# Patient Record
Sex: Female | Born: 1935 | Race: White | Hispanic: No
Health system: Southern US, Community
[De-identification: ages and names within clinical notes are randomized; demographics above are authoritative.]

---

## 1998-10-24 ENCOUNTER — Encounter: Payer: Self-pay | Admitting: Orthopaedic Surgery

## 1998-10-27 ENCOUNTER — Inpatient Hospital Stay (HOSPITAL_COMMUNITY): Admission: RE | Admit: 1998-10-27 | Discharge: 1998-11-03 | Payer: Self-pay | Admitting: Orthopaedic Surgery

## 1998-10-27 ENCOUNTER — Encounter: Payer: Self-pay | Admitting: Orthopaedic Surgery

## 1998-11-03 ENCOUNTER — Inpatient Hospital Stay (HOSPITAL_COMMUNITY)
Admission: RE | Admit: 1998-11-03 | Discharge: 1998-11-11 | Payer: Self-pay | Admitting: Physical Medicine and Rehabilitation

## 1998-11-07 ENCOUNTER — Encounter: Payer: Self-pay | Admitting: Physical Medicine and Rehabilitation

## 2000-10-18 ENCOUNTER — Ambulatory Visit (HOSPITAL_COMMUNITY): Admission: RE | Admit: 2000-10-18 | Discharge: 2000-10-18 | Payer: Self-pay | Admitting: Internal Medicine

## 2000-12-19 ENCOUNTER — Encounter: Admission: RE | Admit: 2000-12-19 | Discharge: 2000-12-19 | Payer: Self-pay | Admitting: Internal Medicine

## 2000-12-19 ENCOUNTER — Encounter: Payer: Self-pay | Admitting: Internal Medicine

## 2000-12-20 ENCOUNTER — Ambulatory Visit (HOSPITAL_COMMUNITY): Admission: RE | Admit: 2000-12-20 | Discharge: 2000-12-20 | Payer: Self-pay | Admitting: Internal Medicine

## 2000-12-30 ENCOUNTER — Ambulatory Visit (HOSPITAL_COMMUNITY): Admission: RE | Admit: 2000-12-30 | Discharge: 2000-12-30 | Payer: Self-pay | Admitting: Internal Medicine

## 2000-12-30 ENCOUNTER — Encounter: Payer: Self-pay | Admitting: Internal Medicine

## 2001-01-10 ENCOUNTER — Encounter: Payer: Self-pay | Admitting: Internal Medicine

## 2001-01-10 ENCOUNTER — Ambulatory Visit (HOSPITAL_COMMUNITY): Admission: RE | Admit: 2001-01-10 | Discharge: 2001-01-10 | Payer: Self-pay | Admitting: Internal Medicine

## 2001-02-10 ENCOUNTER — Encounter: Payer: Self-pay | Admitting: Internal Medicine

## 2001-02-10 ENCOUNTER — Encounter: Admission: RE | Admit: 2001-02-10 | Discharge: 2001-02-10 | Payer: Self-pay | Admitting: Internal Medicine

## 2001-06-13 ENCOUNTER — Encounter: Payer: Self-pay | Admitting: Internal Medicine

## 2001-06-13 ENCOUNTER — Encounter: Admission: RE | Admit: 2001-06-13 | Discharge: 2001-06-13 | Payer: Self-pay | Admitting: Internal Medicine

## 2001-06-17 ENCOUNTER — Inpatient Hospital Stay (HOSPITAL_COMMUNITY): Admission: EM | Admit: 2001-06-17 | Discharge: 2001-06-24 | Payer: Self-pay | Admitting: Emergency Medicine

## 2001-06-19 ENCOUNTER — Encounter: Payer: Self-pay | Admitting: Cardiovascular Disease

## 2001-06-20 ENCOUNTER — Encounter: Payer: Self-pay | Admitting: Cardiovascular Disease

## 2001-06-23 ENCOUNTER — Encounter: Payer: Self-pay | Admitting: Cardiovascular Disease

## 2001-10-16 ENCOUNTER — Encounter (INDEPENDENT_AMBULATORY_CARE_PROVIDER_SITE_OTHER): Payer: Self-pay | Admitting: Specialist

## 2001-10-16 ENCOUNTER — Encounter: Payer: Self-pay | Admitting: Nephrology

## 2001-10-16 ENCOUNTER — Inpatient Hospital Stay (HOSPITAL_COMMUNITY): Admission: AD | Admit: 2001-10-16 | Discharge: 2001-10-18 | Payer: Self-pay | Admitting: Nephrology

## 2001-10-17 ENCOUNTER — Encounter: Payer: Self-pay | Admitting: Nephrology

## 2001-11-06 ENCOUNTER — Encounter (HOSPITAL_COMMUNITY): Admission: RE | Admit: 2001-11-06 | Discharge: 2002-02-04 | Payer: Self-pay | Admitting: Nephrology

## 2002-02-03 ENCOUNTER — Encounter: Payer: Self-pay | Admitting: Critical Care Medicine

## 2002-02-03 ENCOUNTER — Encounter (INDEPENDENT_AMBULATORY_CARE_PROVIDER_SITE_OTHER): Payer: Self-pay

## 2002-02-03 ENCOUNTER — Ambulatory Visit: Admission: RE | Admit: 2002-02-03 | Discharge: 2002-02-03 | Payer: Self-pay | Admitting: Critical Care Medicine

## 2002-02-05 ENCOUNTER — Encounter (HOSPITAL_COMMUNITY): Admission: RE | Admit: 2002-02-05 | Discharge: 2002-05-06 | Payer: Self-pay | Admitting: Nephrology

## 2002-03-26 ENCOUNTER — Encounter: Payer: Self-pay | Admitting: Nephrology

## 2002-05-14 ENCOUNTER — Encounter (HOSPITAL_COMMUNITY): Admission: RE | Admit: 2002-05-14 | Discharge: 2002-08-12 | Payer: Self-pay | Admitting: Nephrology

## 2002-07-30 ENCOUNTER — Encounter: Admission: RE | Admit: 2002-07-30 | Discharge: 2002-07-30 | Payer: Self-pay | Admitting: Endocrinology

## 2002-07-30 ENCOUNTER — Encounter: Payer: Self-pay | Admitting: Endocrinology

## 2002-08-20 ENCOUNTER — Encounter (HOSPITAL_COMMUNITY): Admission: RE | Admit: 2002-08-20 | Discharge: 2002-11-18 | Payer: Self-pay | Admitting: Nephrology

## 2002-08-24 ENCOUNTER — Encounter: Payer: Self-pay | Admitting: Vascular Surgery

## 2002-08-24 ENCOUNTER — Ambulatory Visit (HOSPITAL_COMMUNITY): Admission: RE | Admit: 2002-08-24 | Discharge: 2002-08-24 | Payer: Self-pay | Admitting: Vascular Surgery

## 2002-10-16 ENCOUNTER — Ambulatory Visit (HOSPITAL_COMMUNITY): Admission: RE | Admit: 2002-10-16 | Discharge: 2002-10-17 | Payer: Self-pay | Admitting: Cardiology

## 2002-10-17 ENCOUNTER — Encounter: Payer: Self-pay | Admitting: Cardiology

## 2002-11-09 ENCOUNTER — Encounter: Payer: Self-pay | Admitting: Emergency Medicine

## 2002-11-10 ENCOUNTER — Encounter: Payer: Self-pay | Admitting: General Surgery

## 2002-11-10 ENCOUNTER — Inpatient Hospital Stay (HOSPITAL_COMMUNITY): Admission: EM | Admit: 2002-11-10 | Discharge: 2002-11-20 | Payer: Self-pay | Admitting: Emergency Medicine

## 2002-11-12 ENCOUNTER — Encounter (INDEPENDENT_AMBULATORY_CARE_PROVIDER_SITE_OTHER): Payer: Self-pay | Admitting: Specialist

## 2002-11-12 ENCOUNTER — Encounter: Payer: Self-pay | Admitting: General Surgery

## 2002-11-14 ENCOUNTER — Encounter: Payer: Self-pay | Admitting: General Surgery

## 2002-11-15 ENCOUNTER — Encounter: Payer: Self-pay | Admitting: General Surgery

## 2002-11-16 ENCOUNTER — Encounter: Payer: Self-pay | Admitting: General Surgery

## 2002-11-20 ENCOUNTER — Inpatient Hospital Stay: Admission: RE | Admit: 2002-11-20 | Discharge: 2002-11-27 | Payer: Self-pay | Admitting: Endocrinology

## 2002-11-24 ENCOUNTER — Encounter: Payer: Self-pay | Admitting: Endocrinology

## 2002-12-04 ENCOUNTER — Inpatient Hospital Stay (HOSPITAL_COMMUNITY): Admission: EM | Admit: 2002-12-04 | Discharge: 2002-12-12 | Payer: Self-pay | Admitting: Emergency Medicine

## 2002-12-04 ENCOUNTER — Encounter: Payer: Self-pay | Admitting: Emergency Medicine

## 2002-12-05 ENCOUNTER — Encounter: Payer: Self-pay | Admitting: Cardiology

## 2002-12-24 ENCOUNTER — Encounter (HOSPITAL_COMMUNITY): Admission: RE | Admit: 2002-12-24 | Discharge: 2003-03-24 | Payer: Self-pay | Admitting: Nephrology

## 2003-04-08 ENCOUNTER — Encounter (HOSPITAL_COMMUNITY): Admission: RE | Admit: 2003-04-08 | Discharge: 2003-07-07 | Payer: Self-pay | Admitting: Nephrology

## 2003-05-14 ENCOUNTER — Encounter: Payer: Self-pay | Admitting: Endocrinology

## 2003-05-14 ENCOUNTER — Encounter: Admission: RE | Admit: 2003-05-14 | Discharge: 2003-05-14 | Payer: Self-pay | Admitting: Endocrinology

## 2003-07-08 ENCOUNTER — Encounter (HOSPITAL_COMMUNITY): Admission: RE | Admit: 2003-07-08 | Discharge: 2003-10-06 | Payer: Self-pay | Admitting: Nephrology

## 2003-08-23 ENCOUNTER — Ambulatory Visit (HOSPITAL_COMMUNITY): Admission: RE | Admit: 2003-08-23 | Discharge: 2003-08-23 | Payer: Self-pay | Admitting: Endocrinology

## 2003-10-28 ENCOUNTER — Encounter (HOSPITAL_COMMUNITY): Admission: RE | Admit: 2003-10-28 | Discharge: 2004-01-26 | Payer: Self-pay | Admitting: Nephrology

## 2004-01-27 ENCOUNTER — Encounter (HOSPITAL_COMMUNITY): Admission: RE | Admit: 2004-01-27 | Discharge: 2004-04-26 | Payer: Self-pay | Admitting: Nephrology

## 2004-04-02 ENCOUNTER — Emergency Department (HOSPITAL_COMMUNITY): Admission: EM | Admit: 2004-04-02 | Discharge: 2004-04-02 | Payer: Self-pay | Admitting: Emergency Medicine

## 2004-04-27 ENCOUNTER — Encounter (HOSPITAL_COMMUNITY): Admission: RE | Admit: 2004-04-27 | Discharge: 2004-07-26 | Payer: Self-pay | Admitting: Nephrology

## 2004-06-22 ENCOUNTER — Other Ambulatory Visit: Admission: RE | Admit: 2004-06-22 | Discharge: 2004-06-22 | Payer: Self-pay | Admitting: Endocrinology

## 2004-06-29 ENCOUNTER — Ambulatory Visit (HOSPITAL_COMMUNITY): Admission: RE | Admit: 2004-06-29 | Discharge: 2004-06-29 | Payer: Self-pay | Admitting: Critical Care Medicine

## 2004-07-27 ENCOUNTER — Encounter (HOSPITAL_COMMUNITY): Admission: RE | Admit: 2004-07-27 | Discharge: 2004-10-25 | Payer: Self-pay | Admitting: Nephrology

## 2004-07-31 ENCOUNTER — Inpatient Hospital Stay (HOSPITAL_COMMUNITY): Admission: EM | Admit: 2004-07-31 | Discharge: 2004-08-04 | Payer: Self-pay | Admitting: Emergency Medicine

## 2004-08-01 ENCOUNTER — Encounter: Payer: Self-pay | Admitting: Cardiovascular Disease

## 2004-08-11 ENCOUNTER — Ambulatory Visit: Payer: Self-pay | Admitting: Critical Care Medicine

## 2004-08-23 ENCOUNTER — Ambulatory Visit: Payer: Self-pay | Admitting: Endocrinology

## 2004-09-06 ENCOUNTER — Encounter: Admission: RE | Admit: 2004-09-06 | Discharge: 2004-09-06 | Payer: Self-pay | Admitting: Endocrinology

## 2004-09-14 ENCOUNTER — Ambulatory Visit: Payer: Self-pay | Admitting: Critical Care Medicine

## 2004-10-04 ENCOUNTER — Ambulatory Visit: Payer: Self-pay | Admitting: Endocrinology

## 2004-10-10 ENCOUNTER — Ambulatory Visit: Payer: Self-pay | Admitting: Internal Medicine

## 2004-10-16 ENCOUNTER — Ambulatory Visit: Payer: Self-pay | Admitting: Internal Medicine

## 2004-10-26 ENCOUNTER — Encounter (HOSPITAL_COMMUNITY): Admission: RE | Admit: 2004-10-26 | Discharge: 2005-01-24 | Payer: Self-pay | Admitting: Nephrology

## 2004-11-10 ENCOUNTER — Ambulatory Visit: Payer: Self-pay | Admitting: Critical Care Medicine

## 2004-11-16 ENCOUNTER — Ambulatory Visit: Payer: Self-pay | Admitting: Endocrinology

## 2005-01-09 ENCOUNTER — Ambulatory Visit: Payer: Self-pay | Admitting: Critical Care Medicine

## 2005-02-01 ENCOUNTER — Encounter (HOSPITAL_COMMUNITY): Admission: RE | Admit: 2005-02-01 | Discharge: 2005-05-02 | Payer: Self-pay | Admitting: Nephrology

## 2005-02-08 ENCOUNTER — Ambulatory Visit: Payer: Self-pay | Admitting: Critical Care Medicine

## 2005-03-26 ENCOUNTER — Ambulatory Visit (HOSPITAL_COMMUNITY): Admission: RE | Admit: 2005-03-26 | Discharge: 2005-03-26 | Payer: Self-pay | Admitting: Vascular Surgery

## 2005-03-29 ENCOUNTER — Ambulatory Visit: Payer: Self-pay | Admitting: Critical Care Medicine

## 2005-04-09 ENCOUNTER — Inpatient Hospital Stay (HOSPITAL_COMMUNITY): Admission: AD | Admit: 2005-04-09 | Discharge: 2005-04-14 | Payer: Self-pay | Admitting: Nephrology

## 2005-04-11 ENCOUNTER — Ambulatory Visit: Payer: Self-pay | Admitting: Internal Medicine

## 2005-04-12 ENCOUNTER — Encounter (INDEPENDENT_AMBULATORY_CARE_PROVIDER_SITE_OTHER): Payer: Self-pay | Admitting: *Deleted

## 2005-04-12 ENCOUNTER — Encounter: Payer: Self-pay | Admitting: Internal Medicine

## 2005-04-12 DIAGNOSIS — K222 Esophageal obstruction: Secondary | ICD-10-CM | POA: Insufficient documentation

## 2005-04-12 DIAGNOSIS — D126 Benign neoplasm of colon, unspecified: Secondary | ICD-10-CM

## 2005-04-13 ENCOUNTER — Ambulatory Visit: Payer: Self-pay | Admitting: Internal Medicine

## 2005-04-13 ENCOUNTER — Encounter (INDEPENDENT_AMBULATORY_CARE_PROVIDER_SITE_OTHER): Payer: Self-pay | Admitting: *Deleted

## 2005-05-22 ENCOUNTER — Ambulatory Visit: Payer: Self-pay | Admitting: Internal Medicine

## 2005-05-24 ENCOUNTER — Ambulatory Visit: Payer: Self-pay | Admitting: Critical Care Medicine

## 2005-07-03 ENCOUNTER — Ambulatory Visit: Payer: Self-pay | Admitting: Endocrinology

## 2005-07-04 ENCOUNTER — Encounter: Admission: RE | Admit: 2005-07-04 | Discharge: 2005-07-04 | Payer: Self-pay | Admitting: Nephrology

## 2005-07-31 ENCOUNTER — Ambulatory Visit (HOSPITAL_COMMUNITY): Admission: RE | Admit: 2005-07-31 | Discharge: 2005-07-31 | Payer: Self-pay | Admitting: Cardiovascular Disease

## 2005-08-16 ENCOUNTER — Ambulatory Visit: Payer: Self-pay | Admitting: Internal Medicine

## 2005-08-16 ENCOUNTER — Ambulatory Visit: Payer: Self-pay | Admitting: Critical Care Medicine

## 2005-09-18 ENCOUNTER — Ambulatory Visit (HOSPITAL_COMMUNITY): Admission: RE | Admit: 2005-09-18 | Discharge: 2005-09-18 | Payer: Self-pay | Admitting: Nephrology

## 2005-09-27 ENCOUNTER — Ambulatory Visit: Payer: Self-pay | Admitting: Internal Medicine

## 2005-10-25 ENCOUNTER — Inpatient Hospital Stay (HOSPITAL_COMMUNITY): Admission: RE | Admit: 2005-10-25 | Discharge: 2005-10-26 | Payer: Self-pay | Admitting: Vascular Surgery

## 2005-10-25 ENCOUNTER — Encounter (INDEPENDENT_AMBULATORY_CARE_PROVIDER_SITE_OTHER): Payer: Self-pay | Admitting: *Deleted

## 2005-11-13 ENCOUNTER — Encounter: Admission: RE | Admit: 2005-11-13 | Discharge: 2005-11-13 | Payer: Self-pay | Admitting: Vascular Surgery

## 2005-11-15 ENCOUNTER — Ambulatory Visit: Payer: Self-pay | Admitting: Critical Care Medicine

## 2005-11-20 ENCOUNTER — Ambulatory Visit (HOSPITAL_COMMUNITY): Admission: RE | Admit: 2005-11-20 | Discharge: 2005-11-20 | Payer: Self-pay | Admitting: Thoracic Surgery

## 2005-11-22 ENCOUNTER — Ambulatory Visit: Admission: RE | Admit: 2005-11-22 | Discharge: 2005-11-22 | Payer: Self-pay | Admitting: Critical Care Medicine

## 2005-12-11 ENCOUNTER — Ambulatory Visit (HOSPITAL_COMMUNITY): Admission: RE | Admit: 2005-12-11 | Discharge: 2005-12-11 | Payer: Self-pay | Admitting: Surgery

## 2005-12-11 ENCOUNTER — Encounter (INDEPENDENT_AMBULATORY_CARE_PROVIDER_SITE_OTHER): Payer: Self-pay | Admitting: *Deleted

## 2005-12-20 ENCOUNTER — Encounter: Admission: RE | Admit: 2005-12-20 | Discharge: 2005-12-20 | Payer: Self-pay | Admitting: Surgery

## 2005-12-24 ENCOUNTER — Ambulatory Visit (HOSPITAL_COMMUNITY): Admission: RE | Admit: 2005-12-24 | Discharge: 2005-12-24 | Payer: Self-pay | Admitting: Nephrology

## 2005-12-26 ENCOUNTER — Ambulatory Visit: Admission: RE | Admit: 2005-12-26 | Discharge: 2006-03-26 | Payer: Self-pay | Admitting: Radiation Oncology

## 2005-12-27 ENCOUNTER — Inpatient Hospital Stay (HOSPITAL_COMMUNITY): Admission: RE | Admit: 2005-12-27 | Discharge: 2006-01-08 | Payer: Self-pay | Admitting: Surgery

## 2005-12-27 ENCOUNTER — Encounter (INDEPENDENT_AMBULATORY_CARE_PROVIDER_SITE_OTHER): Payer: Self-pay | Admitting: Specialist

## 2006-01-15 ENCOUNTER — Encounter: Admission: RE | Admit: 2006-01-15 | Discharge: 2006-01-15 | Payer: Self-pay | Admitting: Surgery

## 2006-02-05 ENCOUNTER — Encounter: Admission: RE | Admit: 2006-02-05 | Discharge: 2006-02-05 | Payer: Self-pay | Admitting: Surgery

## 2006-05-20 ENCOUNTER — Encounter: Admission: RE | Admit: 2006-05-20 | Discharge: 2006-05-20 | Payer: Self-pay | Admitting: Surgery

## 2006-05-30 ENCOUNTER — Ambulatory Visit (HOSPITAL_COMMUNITY): Admission: RE | Admit: 2006-05-30 | Discharge: 2006-05-30 | Payer: Self-pay | Admitting: Nephrology

## 2006-06-04 ENCOUNTER — Encounter: Admission: RE | Admit: 2006-06-04 | Discharge: 2006-06-04 | Payer: Self-pay | Admitting: Surgery

## 2006-06-06 ENCOUNTER — Ambulatory Visit (HOSPITAL_COMMUNITY): Admission: RE | Admit: 2006-06-06 | Discharge: 2006-06-06 | Payer: Self-pay | Admitting: Surgery

## 2006-06-14 ENCOUNTER — Ambulatory Visit: Payer: Self-pay | Admitting: Internal Medicine

## 2006-06-18 LAB — CBC WITH DIFFERENTIAL/PLATELET
Basophils Absolute: 0.2 10*3/uL — ABNORMAL HIGH (ref 0.0–0.1)
Eosinophils Absolute: 0.1 10*3/uL (ref 0.0–0.5)
HCT: 43 % (ref 34.8–46.6)
HGB: 13.8 g/dL (ref 11.6–15.9)
LYMPH%: 10 % — ABNORMAL LOW (ref 14.0–48.0)
MCV: 83.2 fL (ref 81.0–101.0)
MONO%: 9.4 % (ref 0.0–13.0)
NEUT#: 3 10*3/uL (ref 1.5–6.5)
Platelets: 144 10*3/uL — ABNORMAL LOW (ref 145–400)

## 2006-06-18 LAB — COMPREHENSIVE METABOLIC PANEL
Albumin: 4.3 g/dL (ref 3.5–5.2)
Alkaline Phosphatase: 145 U/L — ABNORMAL HIGH (ref 39–117)
BUN: 33 mg/dL — ABNORMAL HIGH (ref 6–23)
Glucose, Bld: 99 mg/dL (ref 70–99)
Potassium: 4.2 mEq/L (ref 3.5–5.3)

## 2006-07-02 ENCOUNTER — Encounter (INDEPENDENT_AMBULATORY_CARE_PROVIDER_SITE_OTHER): Payer: Self-pay | Admitting: *Deleted

## 2006-07-02 ENCOUNTER — Ambulatory Visit (HOSPITAL_COMMUNITY): Admission: RE | Admit: 2006-07-02 | Discharge: 2006-07-02 | Payer: Self-pay | Admitting: Internal Medicine

## 2006-07-04 ENCOUNTER — Encounter (INDEPENDENT_AMBULATORY_CARE_PROVIDER_SITE_OTHER): Payer: Self-pay | Admitting: Specialist

## 2006-07-04 ENCOUNTER — Inpatient Hospital Stay (HOSPITAL_COMMUNITY): Admission: RE | Admit: 2006-07-04 | Discharge: 2006-07-13 | Payer: Self-pay | Admitting: Internal Medicine

## 2006-07-04 ENCOUNTER — Ambulatory Visit: Payer: Self-pay | Admitting: Pulmonary Disease

## 2006-07-05 ENCOUNTER — Encounter: Payer: Self-pay | Admitting: Cardiovascular Disease

## 2006-07-05 ENCOUNTER — Encounter (INDEPENDENT_AMBULATORY_CARE_PROVIDER_SITE_OTHER): Payer: Self-pay | Admitting: Interventional Radiology

## 2006-07-12 ENCOUNTER — Ambulatory Visit: Payer: Self-pay | Admitting: Internal Medicine

## 2006-07-29 ENCOUNTER — Inpatient Hospital Stay (HOSPITAL_COMMUNITY): Admission: EM | Admit: 2006-07-29 | Discharge: 2006-08-13 | Payer: Self-pay | Admitting: Emergency Medicine

## 2006-07-30 ENCOUNTER — Ambulatory Visit: Payer: Self-pay | Admitting: Internal Medicine

## 2006-08-27 ENCOUNTER — Encounter: Admission: RE | Admit: 2006-08-27 | Discharge: 2006-08-27 | Payer: Self-pay | Admitting: Surgery

## 2006-09-05 LAB — CBC WITH DIFFERENTIAL/PLATELET
Basophils Absolute: 0.1 10*3/uL (ref 0.0–0.1)
Eosinophils Absolute: 0.6 10*3/uL — ABNORMAL HIGH (ref 0.0–0.5)
HGB: 14.3 g/dL (ref 11.6–15.9)
LYMPH%: 33.7 % (ref 14.0–48.0)
MCH: 27.3 pg (ref 26.0–34.0)
MCV: 89.2 fL (ref 81.0–101.0)
MONO%: 17 % — ABNORMAL HIGH (ref 0.0–13.0)
NEUT#: 3.5 10*3/uL (ref 1.5–6.5)
Platelets: 185 10*3/uL (ref 145–400)
RBC: 5.25 10*6/uL (ref 3.70–5.32)

## 2006-09-05 LAB — COMPREHENSIVE METABOLIC PANEL
Alkaline Phosphatase: 173 U/L — ABNORMAL HIGH (ref 39–117)
BUN: 37 mg/dL — ABNORMAL HIGH (ref 6–23)
Glucose, Bld: 89 mg/dL (ref 70–99)
Total Bilirubin: 1 mg/dL (ref 0.3–1.2)

## 2006-09-16 ENCOUNTER — Ambulatory Visit (HOSPITAL_COMMUNITY): Admission: RE | Admit: 2006-09-16 | Discharge: 2006-09-16 | Payer: Self-pay | Admitting: Vascular Surgery

## 2006-09-16 ENCOUNTER — Emergency Department (HOSPITAL_COMMUNITY): Admission: EM | Admit: 2006-09-16 | Discharge: 2006-09-16 | Payer: Self-pay | Admitting: Emergency Medicine

## 2006-09-16 ENCOUNTER — Emergency Department (HOSPITAL_COMMUNITY): Admission: EM | Admit: 2006-09-16 | Discharge: 2006-09-17 | Payer: Self-pay | Admitting: Emergency Medicine

## 2006-09-17 ENCOUNTER — Emergency Department (HOSPITAL_COMMUNITY): Admission: EM | Admit: 2006-09-17 | Discharge: 2006-09-17 | Payer: Self-pay | Admitting: Emergency Medicine

## 2006-09-17 ENCOUNTER — Inpatient Hospital Stay (HOSPITAL_COMMUNITY): Admission: AD | Admit: 2006-09-17 | Discharge: 2006-09-20 | Payer: Self-pay | Admitting: Otolaryngology

## 2006-10-10 ENCOUNTER — Ambulatory Visit (HOSPITAL_COMMUNITY): Admission: RE | Admit: 2006-10-10 | Discharge: 2006-10-10 | Payer: Self-pay | Admitting: Internal Medicine

## 2006-10-14 ENCOUNTER — Ambulatory Visit: Payer: Self-pay | Admitting: Internal Medicine

## 2006-10-15 ENCOUNTER — Ambulatory Visit (HOSPITAL_COMMUNITY): Admission: RE | Admit: 2006-10-15 | Discharge: 2006-10-15 | Payer: Self-pay | Admitting: Nephrology

## 2006-10-17 LAB — CBC WITH DIFFERENTIAL/PLATELET
Basophils Absolute: 0.1 10*3/uL (ref 0.0–0.1)
Eosinophils Absolute: 0.4 10*3/uL (ref 0.0–0.5)
HGB: 13.1 g/dL (ref 11.6–15.9)
MCV: 85.9 fL (ref 81.0–101.0)
MONO#: 0.6 10*3/uL (ref 0.1–0.9)
MONO%: 9.3 % (ref 0.0–13.0)
NEUT#: 4.2 10*3/uL (ref 1.5–6.5)
RDW: 21.5 % — ABNORMAL HIGH (ref 11.3–14.5)
lymph#: 1.3 10*3/uL (ref 0.9–3.3)

## 2006-10-17 LAB — COMPREHENSIVE METABOLIC PANEL
Albumin: 3.8 g/dL (ref 3.5–5.2)
BUN: 22 mg/dL (ref 6–23)
CO2: 25 mEq/L (ref 19–32)
Calcium: 9.6 mg/dL (ref 8.4–10.5)
Chloride: 98 mEq/L (ref 96–112)
Glucose, Bld: 81 mg/dL (ref 70–99)
Potassium: 4.2 mEq/L (ref 3.5–5.3)

## 2006-10-17 LAB — LACTATE DEHYDROGENASE: LDH: 235 U/L (ref 94–250)

## 2006-10-30 ENCOUNTER — Ambulatory Visit (HOSPITAL_COMMUNITY): Admission: RE | Admit: 2006-10-30 | Discharge: 2006-10-30 | Payer: Self-pay | Admitting: Vascular Surgery

## 2006-12-12 ENCOUNTER — Ambulatory Visit (HOSPITAL_COMMUNITY): Admission: RE | Admit: 2006-12-12 | Discharge: 2006-12-12 | Payer: Self-pay | Admitting: Vascular Surgery

## 2006-12-31 ENCOUNTER — Ambulatory Visit: Payer: Self-pay | Admitting: Vascular Surgery

## 2007-01-09 ENCOUNTER — Ambulatory Visit: Payer: Self-pay | Admitting: Internal Medicine

## 2007-01-14 ENCOUNTER — Ambulatory Visit (HOSPITAL_COMMUNITY): Admission: RE | Admit: 2007-01-14 | Discharge: 2007-01-14 | Payer: Self-pay | Admitting: Internal Medicine

## 2007-01-14 LAB — CBC WITH DIFFERENTIAL/PLATELET
BASO%: 2.9 % — ABNORMAL HIGH (ref 0.0–2.0)
Eosinophils Absolute: 0.5 10*3/uL (ref 0.0–0.5)
MCHC: 32.8 g/dL (ref 32.0–36.0)
MONO#: 0.7 10*3/uL (ref 0.1–0.9)
NEUT#: 3.1 10*3/uL (ref 1.5–6.5)
RBC: 4.49 10*6/uL (ref 3.70–5.32)
RDW: 25.5 % — ABNORMAL HIGH (ref 11.3–14.5)
WBC: 5.6 10*3/uL (ref 3.9–10.0)

## 2007-01-14 LAB — COMPREHENSIVE METABOLIC PANEL
ALT: 26 U/L (ref 0–35)
Albumin: 3.6 g/dL (ref 3.5–5.2)
Alkaline Phosphatase: 118 U/L — ABNORMAL HIGH (ref 39–117)
CO2: 33 mEq/L — ABNORMAL HIGH (ref 19–32)
Glucose, Bld: 87 mg/dL (ref 70–99)
Potassium: 3.7 mEq/L (ref 3.5–5.3)
Sodium: 137 mEq/L (ref 135–145)
Total Protein: 6.9 g/dL (ref 6.0–8.3)

## 2007-01-14 LAB — LACTATE DEHYDROGENASE: LDH: 153 U/L (ref 94–250)

## 2007-02-12 ENCOUNTER — Ambulatory Visit: Payer: Self-pay | Admitting: Internal Medicine

## 2007-02-18 ENCOUNTER — Encounter: Payer: Self-pay | Admitting: Internal Medicine

## 2007-02-18 ENCOUNTER — Ambulatory Visit (HOSPITAL_COMMUNITY): Admission: RE | Admit: 2007-02-18 | Discharge: 2007-02-18 | Payer: Self-pay | Admitting: Internal Medicine

## 2007-02-18 DIAGNOSIS — K2970 Gastritis, unspecified, without bleeding: Secondary | ICD-10-CM | POA: Insufficient documentation

## 2007-02-18 DIAGNOSIS — K2990 Gastroduodenitis, unspecified, without bleeding: Secondary | ICD-10-CM

## 2007-02-25 ENCOUNTER — Ambulatory Visit: Payer: Self-pay | Admitting: Pulmonary Disease

## 2007-02-25 ENCOUNTER — Encounter: Admission: RE | Admit: 2007-02-25 | Discharge: 2007-02-25 | Payer: Self-pay | Admitting: Pulmonary Disease

## 2007-03-06 ENCOUNTER — Ambulatory Visit: Payer: Self-pay | Admitting: Internal Medicine

## 2007-03-11 ENCOUNTER — Encounter: Admission: RE | Admit: 2007-03-11 | Discharge: 2007-03-11 | Payer: Self-pay | Admitting: Nephrology

## 2007-03-27 ENCOUNTER — Ambulatory Visit: Payer: Self-pay | Admitting: Critical Care Medicine

## 2007-04-10 ENCOUNTER — Ambulatory Visit: Payer: Self-pay | Admitting: Internal Medicine

## 2007-04-15 ENCOUNTER — Ambulatory Visit (HOSPITAL_COMMUNITY): Admission: RE | Admit: 2007-04-15 | Discharge: 2007-04-15 | Payer: Self-pay | Admitting: Internal Medicine

## 2007-04-15 LAB — COMPREHENSIVE METABOLIC PANEL
ALT: 21 U/L (ref 0–35)
AST: 29 U/L (ref 0–37)
Alkaline Phosphatase: 120 U/L — ABNORMAL HIGH (ref 39–117)
CO2: 29 mEq/L (ref 19–32)
Creatinine, Ser: 3.77 mg/dL — ABNORMAL HIGH (ref 0.40–1.20)
Sodium: 133 mEq/L — ABNORMAL LOW (ref 135–145)
Total Bilirubin: 1.3 mg/dL — ABNORMAL HIGH (ref 0.3–1.2)
Total Protein: 7 g/dL (ref 6.0–8.3)

## 2007-04-15 LAB — CBC WITH DIFFERENTIAL/PLATELET
BASO%: 1.4 % (ref 0.0–2.0)
EOS%: 8.9 % — ABNORMAL HIGH (ref 0.0–7.0)
LYMPH%: 23.8 % (ref 14.0–48.0)
MCH: 27.8 pg (ref 26.0–34.0)
MCHC: 33 g/dL (ref 32.0–36.0)
MONO#: 0.5 10*3/uL (ref 0.1–0.9)
Platelets: 126 10*3/uL — ABNORMAL LOW (ref 145–400)
RBC: 5.24 10*6/uL (ref 3.70–5.32)
WBC: 5.7 10*3/uL (ref 3.9–10.0)

## 2007-04-15 LAB — LACTATE DEHYDROGENASE: LDH: 147 U/L (ref 94–250)

## 2007-04-29 ENCOUNTER — Ambulatory Visit: Payer: Self-pay | Admitting: Critical Care Medicine

## 2007-05-14 ENCOUNTER — Ambulatory Visit: Payer: Self-pay | Admitting: Internal Medicine

## 2007-05-14 ENCOUNTER — Inpatient Hospital Stay (HOSPITAL_COMMUNITY): Admission: EM | Admit: 2007-05-14 | Discharge: 2007-05-22 | Payer: Self-pay | Admitting: Emergency Medicine

## 2007-05-14 ENCOUNTER — Ambulatory Visit: Payer: Self-pay | Admitting: Pulmonary Disease

## 2007-05-21 ENCOUNTER — Ambulatory Visit: Payer: Self-pay | Admitting: Gastroenterology

## 2007-05-22 ENCOUNTER — Encounter: Payer: Self-pay | Admitting: Internal Medicine

## 2007-07-01 ENCOUNTER — Ambulatory Visit: Payer: Self-pay | Admitting: Vascular Surgery

## 2007-07-11 ENCOUNTER — Ambulatory Visit: Payer: Self-pay | Admitting: Internal Medicine

## 2007-07-15 ENCOUNTER — Ambulatory Visit (HOSPITAL_COMMUNITY): Admission: RE | Admit: 2007-07-15 | Discharge: 2007-07-15 | Payer: Self-pay | Admitting: Internal Medicine

## 2007-07-15 LAB — COMPREHENSIVE METABOLIC PANEL
ALT: 21 U/L (ref 0–35)
AST: 28 U/L (ref 0–37)
BUN: 22 mg/dL (ref 6–23)
Calcium: 9.9 mg/dL (ref 8.4–10.5)
Chloride: 94 mEq/L — ABNORMAL LOW (ref 96–112)
Creatinine, Ser: 3.72 mg/dL — ABNORMAL HIGH (ref 0.40–1.20)
Total Bilirubin: 0.8 mg/dL (ref 0.3–1.2)

## 2007-07-15 LAB — CBC WITH DIFFERENTIAL/PLATELET
BASO%: 1 % (ref 0.0–2.0)
Basophils Absolute: 0.1 10*3/uL (ref 0.0–0.1)
EOS%: 3.5 % (ref 0.0–7.0)
HCT: 37.3 % (ref 34.8–46.6)
HGB: 12.2 g/dL (ref 11.6–15.9)
LYMPH%: 24.5 % (ref 14.0–48.0)
MCH: 28.4 pg (ref 26.0–34.0)
MCHC: 32.6 g/dL (ref 32.0–36.0)
MCV: 87 fL (ref 81.0–101.0)
NEUT%: 55 % (ref 39.6–76.8)
Platelets: 215 10*3/uL (ref 145–400)

## 2007-07-22 ENCOUNTER — Ambulatory Visit: Payer: Self-pay | Admitting: Internal Medicine

## 2007-07-29 ENCOUNTER — Encounter (INDEPENDENT_AMBULATORY_CARE_PROVIDER_SITE_OTHER): Payer: Self-pay | Admitting: Diagnostic Radiology

## 2007-07-29 ENCOUNTER — Inpatient Hospital Stay (HOSPITAL_COMMUNITY): Admission: EM | Admit: 2007-07-29 | Discharge: 2007-08-14 | Payer: Self-pay | Admitting: Emergency Medicine

## 2007-07-29 ENCOUNTER — Ambulatory Visit: Payer: Self-pay | Admitting: Pulmonary Disease

## 2007-08-05 ENCOUNTER — Ambulatory Visit: Payer: Self-pay | Admitting: Surgery

## 2007-08-06 ENCOUNTER — Encounter: Payer: Self-pay | Admitting: Cardiovascular Disease

## 2007-08-08 ENCOUNTER — Ambulatory Visit: Payer: Self-pay | Admitting: Internal Medicine

## 2007-08-08 ENCOUNTER — Encounter: Payer: Self-pay | Admitting: Cardiovascular Disease

## 2007-12-12 DIAGNOSIS — J449 Chronic obstructive pulmonary disease, unspecified: Secondary | ICD-10-CM

## 2007-12-12 DIAGNOSIS — I509 Heart failure, unspecified: Secondary | ICD-10-CM | POA: Insufficient documentation

## 2007-12-12 DIAGNOSIS — M1990 Unspecified osteoarthritis, unspecified site: Secondary | ICD-10-CM | POA: Insufficient documentation

## 2007-12-12 DIAGNOSIS — D649 Anemia, unspecified: Secondary | ICD-10-CM

## 2007-12-12 DIAGNOSIS — I2510 Atherosclerotic heart disease of native coronary artery without angina pectoris: Secondary | ICD-10-CM | POA: Insufficient documentation

## 2007-12-12 DIAGNOSIS — I10 Essential (primary) hypertension: Secondary | ICD-10-CM | POA: Insufficient documentation

## 2007-12-12 DIAGNOSIS — N186 End stage renal disease: Secondary | ICD-10-CM

## 2007-12-12 DIAGNOSIS — C8589 Other specified types of non-Hodgkin lymphoma, extranodal and solid organ sites: Secondary | ICD-10-CM

## 2007-12-12 DIAGNOSIS — C3490 Malignant neoplasm of unspecified part of unspecified bronchus or lung: Secondary | ICD-10-CM | POA: Insufficient documentation

## 2007-12-12 DIAGNOSIS — I4891 Unspecified atrial fibrillation: Secondary | ICD-10-CM

## 2007-12-12 DIAGNOSIS — I2789 Other specified pulmonary heart diseases: Secondary | ICD-10-CM | POA: Insufficient documentation

## 2007-12-12 DIAGNOSIS — E039 Hypothyroidism, unspecified: Secondary | ICD-10-CM | POA: Insufficient documentation

## 2007-12-12 DIAGNOSIS — E278 Other specified disorders of adrenal gland: Secondary | ICD-10-CM | POA: Insufficient documentation

## 2007-12-12 DIAGNOSIS — J4489 Other specified chronic obstructive pulmonary disease: Secondary | ICD-10-CM | POA: Insufficient documentation

## 2007-12-12 DIAGNOSIS — E7800 Pure hypercholesterolemia, unspecified: Secondary | ICD-10-CM

## 2007-12-12 DIAGNOSIS — N2581 Secondary hyperparathyroidism of renal origin: Secondary | ICD-10-CM | POA: Insufficient documentation

## 2008-04-09 IMAGING — CT NM PET TUM IMG SKULL BASE T - THIGH
4 series · 25 of 25 positions shown · IV contrast (350 OM)
Comparison: PET of 11/20/05 and chest CT of 06/04/06.

CLINICAL DATA: Left lower lobe lung cancer.  COPD.  Dialysis.  Resection 12/27/05.  Radioactive bead placement.  Recent nodule identified in the left upper lobe.  
FDG PET-CT TUMOR IMAGING (SKULL BASE TO THIGHS):

Fasting Blood Glucose:  91.
TECHNIQUE: 15.3 mCi F-18 FDG were administered via right forearm.  Full ring PET imaging was performed from the skull base through the mid-thighs 65 minutes after injection.  CT data was obtained and used for attenuation correction and anatomic localization only.  (This was not acquired as a diagnostic CT examination.)

[Series 1: pet ac · axial · 3.3mm · 4.69mm/px · z∈[-744,-18]mm · 8 of 223 slices shown]
[im 1/223]
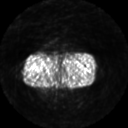
[im 32/223]
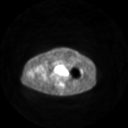
[im 64/223]
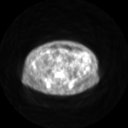
[im 96/223]
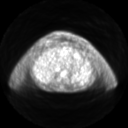
[im 127/223]
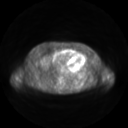
[im 159/223]
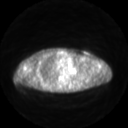
[im 191/223]
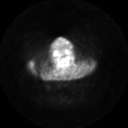
[im 223/223]
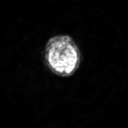

[Series 2: ct images · axial · 3.8mm · 0.98mm/px · z∈[-744,-18]mm · 8 of 223 slices shown]
[im 1/223]
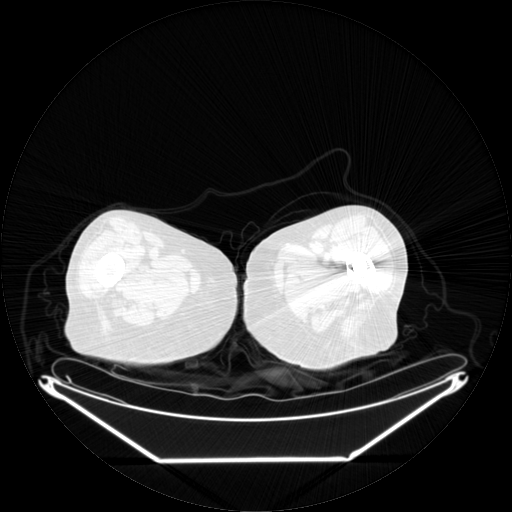
[im 32/223]
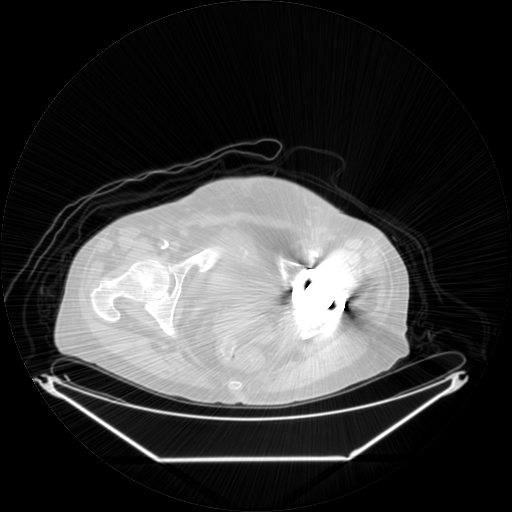
[im 64/223]
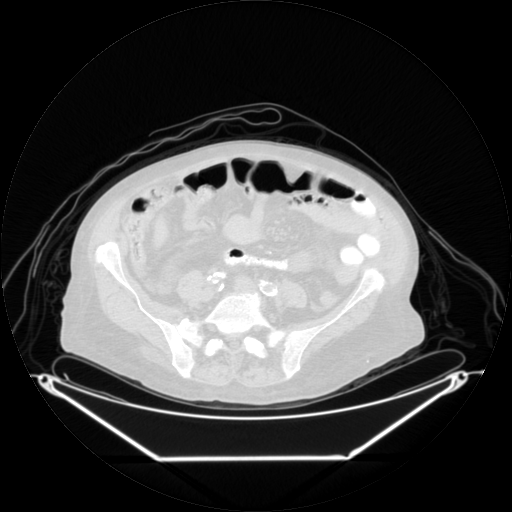
[im 96/223]
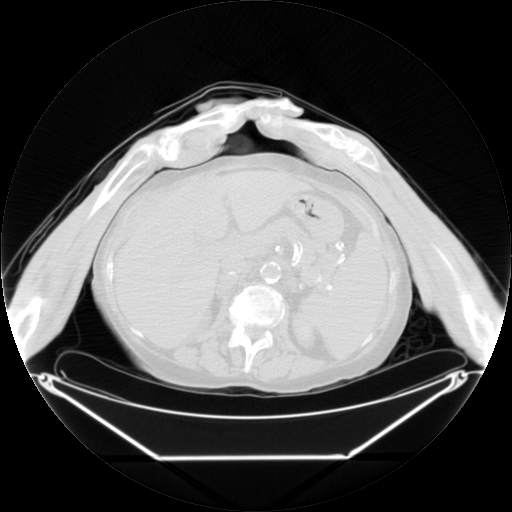
[im 127/223]
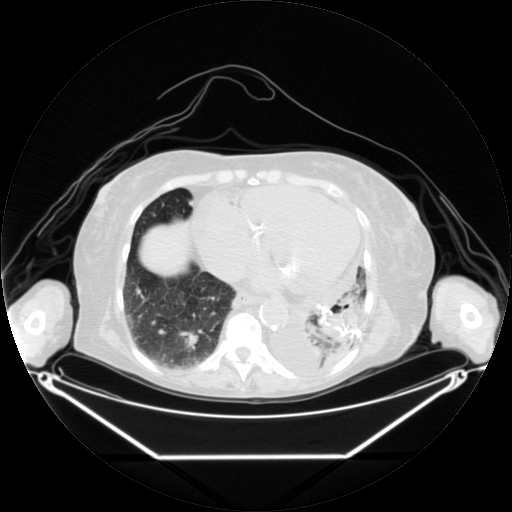
[im 159/223]
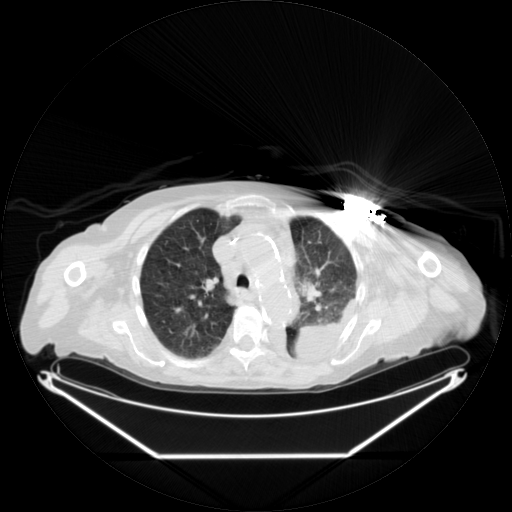
[im 191/223]
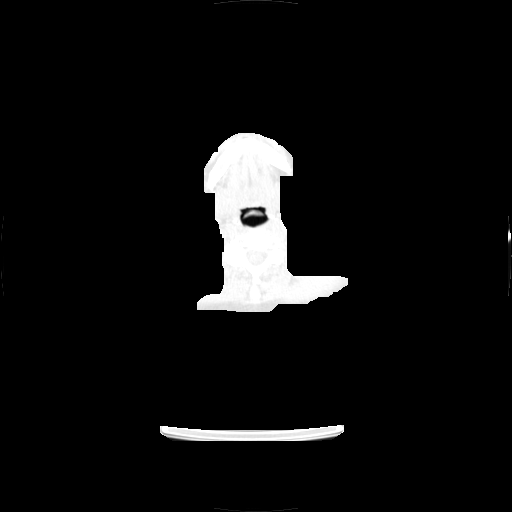
[im 223/223  brain]
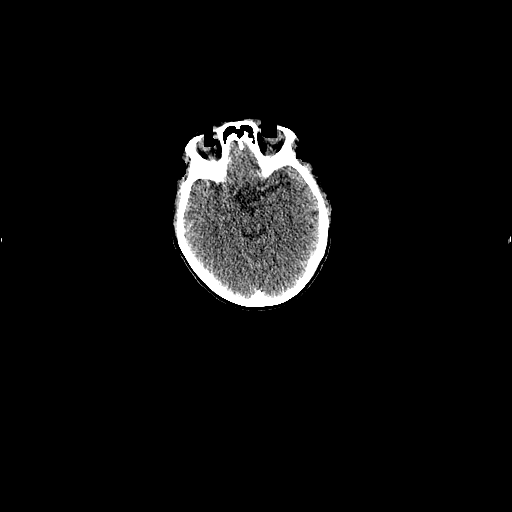

[Series 2: pet nac · axial · 3.3mm · 4.69mm/px · z∈[-744,-18]mm · 8 of 223 slices shown]
[im 1/223]
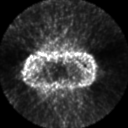
[im 32/223]
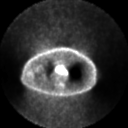
[im 64/223]
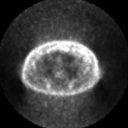
[im 96/223]
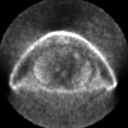
[im 127/223]
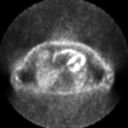
[im 159/223]
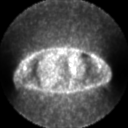
[im 191/223]
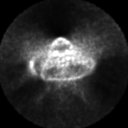
[im 223/223]
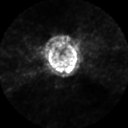

[Series 123: mip · coronal · 3.3mm · 4.69mm/px · 1 of 30 slices shown]
[im 1/30]
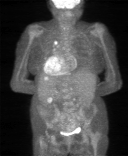

[25 of 25 positions shown; findings below may reference images not displayed]

FINDINGS: Corresponding to the new nodule in the left upper lobe, there is a focus of hypermetabolism measuring a maximum SUV of 5.7 gm/ml.  The nodule is seen on image 63 and measures 1.6 X 2.0 cm.  This measurement is likely less accurate than the 06/04/06 diagnostic CT.  
  Pre-vascular lymph nodes are now mildly hypermetabolic.  A index pre-vascular node measures maximum SUV of 2.3 gm/ml and approximately 1.2 cm on image 66.  There are mildly hypermetabolic middle mediastinal lymph nodes, which are more equivocal. 
There is mild left infrahilar hypermetabolism with a maximum SUV of 2.5 gm/ml. 
At the site of surgical change in the left lower lobe adjacent to pleural thickening and radioactive seed, there is mild nonspecific pleural-based activity measuring a maximum SUV of 2.3 gm/ml on image 88.
As described on the prior PET, in the central aspect of the spleen, there is hypermetabolism with a maximum SUV of 3.3 gm/ml.  This is decreased from 5.5 gm/ml previously.  A second more inferior focus of hypermetabolism measures a maximum SUV of 4.2 gm/ml.  On the prior exam this same location measured approximately 5.3 gm/ml.
There is a soft tissue nodule on image 128 in the retrocaval space.  This corresponds to an enlarged lymph node on the diagnostic CT, which measures 1.2 X 2.3 cm.  This area is mildly hypermetabolic with a maximum SUV of 2.2 gm/ml.  This is felt separate from the adjacent adrenal gland, which appears normal on the diagnostic CT. 
CT images for attenuation correction demonstrate no significant findings in the neck.  The chest findings will defer to recent diagnostic chest CT. 
Prior cholecystectomy.  Nonspecific morphology from the liver, which could relate to early cirrhosis, given the extent of splenomegaly and enlargement of the portal vein on the diagnostic CT.  Small amount of ascites.
Left hip arthroplasty.
IMPRESSION: 1.  Malignant range activity within a left upper lobe lung nodule.  In addition, there is hypermetabolism within pre-vascular and middle mediastinal lymph nodes and borderline hypermetabolism along the left pleural space.  Findings are most suspicious for recurrent/metastatic disease.  A new primary is felt less likely.  
2.  Two splenic lesions remain hypermetabolic, but less so than on the prior PET.  These are indeterminate and poorly visualized on the unenhanced CT.  Metastasis remains in the differential but the relative stability since [REDACTED] (presuming no history of chemotherapy) argues against this.    
3.  Suspicious retrocaval lymph node with mild hypermetabolism.  
4.  Possible cirrhosis with small amount of ascites.   Recommend clinical correlation.

## 2010-10-28 ENCOUNTER — Encounter: Payer: Self-pay | Admitting: Nephrology

## 2010-10-29 ENCOUNTER — Encounter: Payer: Self-pay | Admitting: Surgery

## 2010-10-29 ENCOUNTER — Encounter: Payer: Self-pay | Admitting: Internal Medicine

## 2010-10-29 ENCOUNTER — Encounter: Payer: Self-pay | Admitting: Nephrology

## 2011-02-06 DEATH — deceased

## 2011-02-20 NOTE — H&P (Signed)
[name], [name] NO.:  [id number]   MEDICAL RECORD NO.:  [id number]          PATIENT TYPE:  INP   LOCATION:  3712                         FACILITY:  [facility]   PHYSICIAN:  Vesta Mixer, M.D. DATE OF BIRTH:  02/22/1925   DATE OF ADMISSION:  07/29/2007  DATE OF DISCHARGE:                              HISTORY & PHYSICAL   HISTORY:  [name] [name] is an 77 year old female with a history of end-  stage renal disease, lung cancer, pulmonary hypertension and congestive  heart failure.  She also has sick sinus syndrome and has a pacemaker.   She has only minimal coronary artery irregularities by heart  catheterization in October 2006.  She has a history of congestive heart  failure which seems to be pacemaker mediated.  She has had  echocardiograms revealing a left ventricular systolic function as low as  15-30% in the past.  We have made some alterations to her pacemaker and  now her ejection fraction is around 50%.  She has a history of end-stage  renal disease and has had multiple complications related to that.  She  has had MRSA infections and has had to receive vancomycin therapy.   She has a history of recurrent lung cancer.  She also has pulmonary  hypertension with an estimated pulmonary artery pressure of 67 mmHg.   She is also found to have mild aortic stenosis, mild-moderate tricuspid  regurgitation and moderate pleural effusions.  She has moderate right  and left atrial enlargement.  Of note, her last echocardiogram was in  June 2008 which revealed an ejection fraction of 45-50%.  She was last seen in pacemaker clinic and her pacemaker was found to be  functioning normally.   She apparently was recently in to see Dr. Briant Cedar.  She was found to  have a large right pleural effusion and was scheduled have an outpatient  thoracentesis.  She presented today to our office for a routine visit.   The patient has not felt well for the past several weeks.   She describes  feeling very weak and is not able to get around to do much.  She also  complains of having lots of pain in her right shoulder as well as her  left knee pain.  She has had some problems with her knee and also  apparently has some peripheral vascular disease.   She was seen in the office today and was found to have an O2 saturation  of 78% on 2 liters of nasal cannula.  She was admitted to the hospital  for further evaluation.   CURRENT MEDICATIONS:  1. Levoxyl 125 mcg a day.  2. Toprol XL 50 mg a day.  3. Aspirin 81 mg a day.  4. Nephrovite once a day.  5. PhosLo 667 mg a day.  6. Epogen each week.  7. Iron supplements each week.  8. Tramadol as needed.  9. Temazepam 15 mg q.h.s.  10.Cardizem CD 300 mg a day.  11.Spiriva inhaler each day.  12.Vancomycin with dialysis.  13.Avapro 150 mg a day.  14.Albuterol metered-dose inhaler as needed.  15.Acetaminophen 1/2 tablet a day.  16.HCTZ each day.   ALLERGIES:  1. KEFLEX.  2. She is also reported allergic to different -MYCINS.   PAST MEDICAL HISTORY:  1. History of congestive heart failure - most of this seems to be      pacemaker mediated.  2. Mild coronary artery disease.  3. History of atrial fibrillation.  4. End-stage renal disease.  5. History of lung cancer.  6. COPD.  7. Hypothyroidism.  8. History of adrenal mass.  9. Pulmonary hypertension.   SOCIAL HISTORY:  The patient used to smoke, but quit in 2000.  She does  not drink alcohol.   FAMILY HISTORY:  Noncontributory.   REVIEW OF SYSTEMS:  The patient has felt relatively poorly for the past  several weeks.  Her overall health has been declining over the past  several years.   PHYSICAL EXAMINATION:  GENERAL:  On exam, she is an elderly female in  mild-moderate distress.  VITAL SIGNS:  Weight is 126, blood pressure 110/60 with a heart rate of  62.  Her O2 saturation was 78% on her intermittent 2 liters of nasal  cannula.  HEENT:  Reveals 2+  carotids, mildly elevated JVD.  She has no  thyromegaly.  LUNGS:  Reveals markedly diminished breath sounds on the right lung  field at least  one-half to three-quarters of the way up.  Her left lung field has a few  scattered rales.  HEART:  Regular rate, S1-S2.  She has a soft systolic murmur.  ABDOMEN:  Reveals good bowel sounds and is nontender.  EXTREMITIES:  She has no clubbing, cyanosis or edema.  NEURO:  Nonfocal.   ASSESSMENT:  [name] presents with generalized failure to thrive.  She has multiple medical problems and a fairly extensive medical  history.  I discussed the case with Dr. Delford Field who she was to see  earlier today.  In addition, she was scheduled to have an outpatient  thoracentesis on Thursday.  We will go ahead and admit her to the  hospital, and give her additional supplementary oxygen.  We will  schedule her to have a thoracentesis today if possible.  We will go  ahead and get a PA and lateral as well as right and left lateral  decubitus films.   She has had an echocardiogram just several months ago.  Her left  ventricular systolic function was fairly well preserved with an EF of 45-  50%.  I do not think that this is due to an exacerbation of congestive  heart failure.  She does have mild-moderate valvular disease, but again  this should not be causing much problems.   PLAN:  We will have the renal team see her as well as pulmonary and we  will continue to follow along.           ______________________________  Vesta Mixer, M.D.     PJN/MEDQ  D:  07/29/2007  T:  07/30/2007  Job:  [id number]   cc:   Dyke Maes, M.D.  Charlcie Cradle Delford Field, MD, [facility]  Corwin Levins, MD  Quita Skye. Hart Rochester, M.D.

## 2011-02-20 NOTE — Assessment & Plan Note (Signed)
[facility]                             PULMONARY OFFICE NOTE   NAME:ROBINSONMarlia, Schewe                    MRN:          [id number]  DATE:02/25/2007                            DOB:          25-Oct-1924    HISTORY OF PRESENT ILLNESS:  The patient is an 77 year old female  patient of Dr. Charlcie Cradle. [facility], who has a very complicated medical  history with known chronic obstructive pulmonary disease, asthmatic  bronchitis, non-small cell carcinoma of the left lower lobe, status post  left lower wedge resection with radioactive seed implantation in March  2007, and end-stage renal disease, now on hemodialysis.   The patient presents today complaining of a one-week history of  increased productive cough with thick yellowish-brown sputum, nasal  congestion, intermittent wheezing and lower extremity edema.  The  patient reports she has had increasing edema over the last week.  She  has had to have an extra hemodialysis session this week.  The patient  does complain that her right leg has been quite a bit more swollen than  her left.  The patient reports that this happens quite a bit.  The  patient denies any hemoptysis, orthopnea or PND.  The patient is on  continuous oxygen therapy at 2 liters.   PAST MEDICAL HISTORY:  Was reviewed in detail.   CURRENT MEDICATIONS:  Are correct and reviewed.   PHYSICAL EXAMINATION:  GENERAL:  The patient is an elderly female, in no  acute distress.  VITAL SIGNS:  She is afebrile, blood pressure 146/66, O2 saturation 95%  on 2 liters.  Weight is at 128 pounds.  HEENT:  Unremarkable.  NECK:  Supple without cervical adenopathy.  No jugular venous  distention.  LUNGS:  Sounds reveal coarse breath sounds bilaterally with a few  scattered rhonchi.  HEART:  A regular rate.  ABDOMEN:  Soft, no palpable hepatosplenomegaly.  EXTREMITIES:  Are warm with 1-2+ edema, right greater than left.  The  patient does have some calf  tenderness on the right with a positive  Homan's sign.  Pulses intact.   IMPRESSION/PLAN:  1. Acute tracheobronchitis:  The patient is to begin Avelox x5 days.      Add in Mucinex DM twice daily.  May use Tussionex #4 ounces, one      teaspoonful q.12h. p.r.n. severe cough.  The patient is aware of      sedating effect.  She will return back here in two weeks with Dr.      Delford Field, or sooner if needed.  2. Asymmetrical leg swelling:  I suspect this is chronic in nature;      however, will send the      patient for a venous Doppler, to rule out possible underlying deep      venous thrombosis.  This will be set up later today.  Will follow      up accordingly.      Rubye Oaks, NP  Electronically Signed      Charlcie Cradle Delford Field, MD, [facility]  Electronically Signed   TP/MedQ  DD: 02/25/2007  DT: 02/25/2007  Job #:  [id number] 

## 2011-02-20 NOTE — Procedures (Signed)
LOWER EXTREMITY ARTERIAL EVALUATION-SINGLE LEVEL   INDICATION:  Bilateral leg pain, left greater than right.   HISTORY:  Diabetes:  No.  Cardiac:  Pacemaker.  Hypertension:  Yes.  Smoking:  Quit in 2000.  Previous Surgery:  Left carotid endarterectomy with DPA on 10/25/2005 by  Dr. Hart Rochester.   RESTING SYSTOLIC PRESSURES: (ABI)                          RIGHT                LEFT  Brachial:               110.                 AVGG.  Anterior tibial:        Inaudible.           Inaudible.  Posterior tibial:       16 (0.54).           40 (0.36).  Peroneal:               50.                  Inaudible.  DOPPLER WAVEFORM ANALYSIS:  Anterior tibial:  Posterior tibial:       Monophasic           Monophasic  Peroneal:               Monophasic   PREVIOUS ABI'S:  Date:  RIGHT:  LEFT:   IMPRESSION:  Bilateral lower extremity arterial occlusive disease, left  greater than right.   ___________________________________________  Quita Skye. Hart Rochester, M.D.   DP/MEDQ  D:  07/01/2007  T:  07/02/2007  Job:  [id number]

## 2011-02-20 NOTE — Procedures (Signed)
CAROTID DUPLEX EXAM   INDICATION:  Followup carotid artery disease.   HISTORY:  Diabetes:  No.  Cardiac:  Pacemaker.  Hypertension:  Yes.  Smoking:  Quit in 2000.  Previous Surgery:  Left carotid endarterectomy with DPA on October 25, 2005 by Dr. Hart Rochester.  CV History:  Amaurosis Fugax No, Paresthesias No, Hemiparesis No.                                       RIGHT             LEFT  Brachial systolic pressure:         110.              AVGG.  Brachial Doppler waveforms:         Biphasic.  Vertebral direction of flow:        Antegrade.        Antegrade.  DUPLEX VELOCITIES (cm/sec)  CCA peak systolic                   57.               142.  ECA peak systolic                   63.               63.  ICA peak systolic                   82.               28.  ICA end diastolic                   14.               8.  PLAQUE MORPHOLOGY:                  Mixed.            None.  PLAQUE AMOUNT:                      Mild.             None.  PLAQUE LOCATION:                    ICA.              None.   IMPRESSION:  1. A 20 to 39% right internal carotid artery stenosis.  2. No left internal carotid artery stenosis status post      endarterectomy.  3. Studies essentially unchanged from December 31, 2006.   ___________________________________________  Quita Skye. Hart Rochester, M.D.   DP/MEDQ  D:  07/01/2007  T:  07/02/2007  Job:  [id number]

## 2011-02-20 NOTE — Op Note (Signed)
NAMEANNA-MARIE, COLLER             ACCOUNT NO.:  [id number]   MEDICAL RECORD NO.:  [id number]          PATIENT TYPE:  INP   LOCATION:  3712                         FACILITY:  [facility]   PHYSICIAN:  Juleen China IV, MDDATE OF BIRTH:  02-27-1925   DATE OF PROCEDURE:  08/05/2007  DATE OF DISCHARGE:                               OPERATIVE REPORT   SURGEON:  1. Durene Cal IV, MD.   REASON FOR STUDY:  Left leg pain and abdominal pain.   PROCEDURES PERFORMED:  1. Abdominal aortogram.  2. Bilateral lower extremity runoff.  3. Right common femoral artery closure device (StarClose).  4. Conscious sedation.  5. Ultrasound guided access of right common femoral artery.  6. Catheter in aorta x1.   INDICATIONS:  This is an 77 year old female with a left leg  claudication.  There is also a concern for mesenteric ischemia.  Risks  and benefits of performing an arteriogram were discussed with the  patient.  Informed consent was signed.   PROCEDURE:  The patient was identified in the holding area and taken to  room 8.  She was placed supine on the table.  Bilateral groins were  prepped and draped in a standard, sterile fashion.  Lidocaine 1% was  used for local anesthesia.  The right common femoral artery was  evaluated with ultrasound and noted to be patent.  Using an 18-gauge  needle, the right common femoral artery was accessed under ultrasound  guidance.  An 0.035 wire was advanced in a retrograde fashion into the  abdominal aorta under fluoroscopic visualization.  Next, a 5-French  sheath was placed.  Over the wire, an Omni-Flush catheter was placed at  the level L1 and an abdominal aortogram was obtained.  Next, the image  intensifier was rotated into a lateral view and imaging of the  mesenteric vasculature was obtained.  Next, the catheter was pulled down  to the aortic bifurcation and pelvic angiogram was obtained.  This was  followed by a lower extremity runoff.   During the  access portion of the procedure, the patient was given a mg  of Versed and 25 mcg of fentanyl.  This led to decrease in her oxygen  saturations which required reversal with flumazenil.  Once she was  reversed, her oxygen saturations came up.  She was not given more  sedation for the procedure.   FINDINGS:  Aortogram:  There was extensive calcification throughout the  abdominal aorta.  Contrast does not opacify the kidneys.  The splenic  artery is tortuous and noted to be heavily calcified.  On lateral  imaging, the celiac access is visualized and noted to be widely patient  without hemodynamically significant stenoses.  The superior mesenteric  artery is also well visualized and is widely patent without evidence of  hemodynamically significant stenoses.  The infrarenal abdominal aorta is  widely patient.   Pelvic angiogram:  Multiple images were taken and multiple obliques  looking at the pelvis.  This also included pullback pressures.  The  right common iliac and external iliac artery are patent.  Pullback  pressures demonstrated no increase  or decrease in pressures to suggest  stenosis.  The left iliac system is completely occluded.  There are  multiple pelvic collaterals which give rise to a reconstituted common  femoral artery at the level of the femoral head.   Right lower extremity:  The right common femoral artery is noted to be  patent, however, heavily calcified.  There is moderate disease within  the common femoral artery.  The superficial femoral artery is occluded,  however, there is reconstitution from profunda collaterals at the level  of adductors canal.  The popliteal artery is widely patent with minimal  disease.  The anterior tibial with tibioperoneal trunk, perineal and  posterior tibial arteries are all at the level of the knee, however,  they become diminutive and then occlude, letting the posterior tibial be  the dominant runoff vessel across the ankle.   Left  lower extremity:  The left common femoral artery is heavily  diseased.  The left superficial femoral artery is occluded.  The  profunda femoral artery is patent.  There is reconstitution of the  popliteal artery from profunda collaterals on the left.  The dominant  runoff to the left leg is the posterior tibial artery.   After the above images were obtained, the decision was made not to  intervene.  The right groin was closed with a StarClose.   IMPRESSION:  1. No evidence of mesenteric occlusive disease.  2. Widely patent right iliac system.  3. Occluded left iliac system with reconstitution of the left common      femoral artery.  4. Occluded right superficial femoral artery with reconstitution in      Hunter's canal.  5. Popliteal artery is patent and there is single vessel runoff via      the posterior tibial.  6. Reconstitution of the left common femoral artery.  7. The left superficial femoral artery is occluded with reconstitution      of popliteal artery and single vessel runoff via the posterior      tibial artery.      Jorge Ny, MD  Electronically Signed     VWB/MEDQ  D:  08/05/2007  T:  08/05/2007  Job:  [id number]

## 2011-02-20 NOTE — H&P (Signed)
NAMEMERRIT, WAUGH             ACCOUNT NO.:  [id number]   MEDICAL RECORD NO.:  [id number]          PATIENT TYPE:  INP   LOCATION:  3309                         FACILITY:  [facility]   PHYSICIAN:  Rufina Falco, M.D.     DATE OF BIRTH:  01-May-1925   DATE OF ADMISSION:  05/14/2007  DATE OF DISCHARGE:                              HISTORY & PHYSICAL   No dictation for this job.      Rufina Falco, M.D.  Electronically Signed     JY/MEDQ  D:  05/15/2007  T:  05/16/2007  Job:  [id number]

## 2011-02-20 NOTE — Assessment & Plan Note (Signed)
OFFICE VISIT   BRIZZA, NATHANSON  DOB:  [birth year]                                       07/01/2007  GNFAO#:[id number]   The patient returns today for followup regarding her carotid occlusive  disease.  She has had no carotid symptoms since I last saw her in 2007.  She has had no hemispheric or non-hemispheric TIA, amaurosis fugax,  diplopia, blurred vision, or syncope.  Her biggest complaint is weakness  in her legs when she ambulates.  She is only able to walk about 50 feet  and then develops some severe weakness in both legs.  She has no  numbness or tingling in her feet and has no rest pain, and is able to  sleep at night.  She is on home oxygen and has end-stage renal disease,  and is 77 years old.   EXAMINATION:  Her carotid pulses are 3+ with no audible bruits.  Neurologic exam is normal.  Abdomen is obese.  No palpable masses.  She  has essentially absent femoral pulses at 1+ at the most with no distal  pulses.  Both feet are adequately perfused with no evidence of infection  or ulceration.   ABIs are 36% on the left and 54% on the right.   I feel that she probably has an aortic occlusion or severe iliac  occlusive disease, and if we need to proceed with revascularization, we  can do an axillobifemoral bypass graft for her.  At this point, because  of her generally frail condition and multiple medical problems, I would  not  recommend any surgery unless her symptoms worsen.  If that occurs, she  will be in touch with me.  Otherwise, I will continue to follow her on  an annual basis on the protocol.   Quita Skye Hart Rochester, M.D.  Electronically Signed   JDL/MEDQ  D:  07/01/2007  T:  07/02/2007  Job:  [id number]   cc:   Dyke Maes, M.D.

## 2011-02-20 NOTE — H&P (Signed)
[name] [name], [name] [name]             ACCOUNT NO.:  [id number]   MEDICAL RECORD NO.:  [id number]          PATIENT TYPE:  INP   LOCATION:  3309                         FACILITY:  [facility]   PHYSICIAN:  Rufina Falco, M.D.     DATE OF BIRTH:  05/03/1925   DATE OF ADMISSION:  05/14/2007  DATE OF DISCHARGE:                              HISTORY & PHYSICAL   CHIEF COMPLAINT:  Abdominal pain/shortness of breath/chest  tightness/black stools.   HPI:  Patient is an 77 year old Caucasian woman with past medical  history significant for end-stage renal disease secondary to FSG,  hypertension, arteriosclerosis, history of CHF, atrial fibrillation,  hyperthyroidism, and GI bleed of unknown etiology, presenting to [facility] ED secondary to increased shortness of breath, chest tightness and  dark-colored stools.  Patient reports that she has been having dark  stools for over a week with increasing shortness of breath and chest  tightness.  She describes chest pain as aching, constant, worse with  breathing, no radiation.  Chest pain not associated with diaphoresis and  vomiting, but is associated with cough and nausea.  Patient complains of  epigastric abdominal pain that is aching, constant and alleviated with  holding of her abdomen.  Patient denies fever, chills and sputum  production.   PAST MEDICAL HISTORY:  1. End-stage renal disease, secondary to FSG (hemodialysis for two      years now).  2. Hypertension.  3. Arteriosclerosis.  4. CHF, followed by Dr. Elease Hashimoto.  5. Paroxysmal atrial fibrillation.  6. Hyperthyroidism.  7. Hyperlipidemia.  8. DJD.  9. COPD, followed by Dr. Sherene Sires.  10.Status post cholecystectomy February, 2005.  11.Secondary hyperparathyroidism.  12.Anemia.  13.History of GI bleed, unknown etiology.  14.History of tobacco abuse.  15.Adrenal mass, question adenoma.  16.Gout.  17.Status post carotid endarterectomy October 25, 2005.  18.Status post left lung wedge resection  due to non-small cell cancer      with radioactive seed implant, followed by Dr. Arbutus Ped.  19.Status post splenectomy.  20.Non-Hodgkins lymphoma.   ALLERGIES:  ALLOPURINOL, COLCHICINE, CEPHALOSPORINS, ERYTHROMYCIN,  ANCEF.   FAMILY HISTORY:  Noncontributory.   SOCIAL HISTORY:  Tobacco abuse.  Patient quit in 2000.  She has 52 years  of smoking with three to four packs daily.  She denies alcohol.  She  does have a history of heavy alcohol use in the past, denies illicit  drugs, widow, lives in [place] by herself.   HOME MEDS:  1. Avapro.  2. Spiriva.  3. Cartia XT 240 mg p.o. daily.  4. Restoril 30 mg p.o. q.h.s. p.r.n.  5. PhosLo t.i.d.  6. Aspirin 81 mg p.o. daily.  7. Toprol-XL 50 mg p.o. q.h.s.  8. Nephrovite one pill p.o. daily.  9. Levothyroxine 125 mcg p.o. daily.   PHYSICAL EXAMINATION:  VITAL SIGNS:  Temperature 97.0, respiratory rate  20, pulse 71, blood pressure 127/47, O2 sat equals 98% on room air.  GENERAL:  Mild respiratory distress.  HEENT:  AT, [place], no lymphadenopathy, moist oral mucosa.  CV:  S1, S2, regular rate and rhythm.  LUNGS:  Decreased breath sounds, mild wheezing.  ABDOMEN:  Soft, tenderness diffusely, but worse in the epigastric area,  no guarding, no rebound, no bowel sounds noted.  NEURO:  Alert and oriented x3, nonfocal.  EXTREMITIES:  No edema, left upper extremity graft.  SKIN:  Ecchymosis on left shoulder blade and on bilateral upper  extremities.  RECTAL:  FOBT positive.   ADMISSION LABS:  WBC 8.7, hemoglobin 7.3, which was 11 approximately one  week ago, hematocrit 21.7, platelets 197, MCV 83.8, RDW 22.6.  UA  cloudy, specific gravity equals 1.015, pH equals 8.0, glucose equals  100, hemoglobin equals small, bilirubin negative, ketones negative,  total protein greater than 300, urine urobilinogen 0.2, nitrite  negative, leukocyte esterase trace, WBCs 11-20, RBCs 3-6, bacteria few,  point of care markers negative.  Sodium 136,  potassium 3.4, chloride 97,  bicarb 36, BUN 20, glucose 91.   ASSESSMENT/PLAN:  1. Acute blood loss anemia, likely upper gastrointestinal bleed.  Will      admit patient to the step-down unit, type and cross 2 units and      transfuse 1 unit over four hours and likely transfuse more during      hemodialysis.  Check CBC q.8h., start Protonix 40 mg IV b.i.d.,      consult GI in the morning, check acute abdominal series to rule out      perforation, make n.p.o., check lipase, check PT, check PTT, check      EKG, check cardiac enzymes x3 and hold aspirin.  2. Substernal chest pain.  Likely secondary to patient's decreased      hemoglobin.  Will rule out myocardial infarction with cardiac      enzymes x3.  Will repeat an EKG in the morning.  Will hold aspirin      secondary to number one.  At this point, will hold beta-blocker      secondary to the patient's shortness of breath.  I will continue      her Cartia and hold her Avapro.  Will consider cards consult in the      morning.  Follow up cardiac enzymes and EKG.  3. End-stage renal disease.  Will continue hemodialysis Mondays,      Wednesdays and Fridays.  4. Chronic obstructive pulmonary disease/wheezing with shortness of      breath.  Will put her on albuterol and Atrovent nebulizers, no      steroids secondary to number one, O2 via nasal cannula, and will      continue Spiriva.  Will hold off on antibiotics for now secondary      to the patient being afebrile and without increasing WBC.  5. Paroxysmal atrial fibrillation.  Will continue [place].  Repeat EKG      in the morning.  Consider cards consult.  If the patient goes into      RVR, will consider diltiazem drip.  Will hold Toprol-XL secondary      to the patient's shortness of breath.  6. Hypertension, blood pressure stable.  Will hold Avapro secondary to      the patient's GI bleed.  7. Non-small cell lung cancer.  Patient is status post left lower lung      resection and  radioactive seeds.  She follows Dr. Arbutus Ped for this.  8. FEN.  N.p.o. for now, secondary to possible EGD in the morning.  9. History of congestive heart failure, stable.  10.Prophylaxis.  Protonix/SCDs.      Rufina Falco, M.D.  Electronically Signed     JY/MEDQ  D:  05/15/2007  T:  05/16/2007  Job:  [id number]

## 2011-02-20 NOTE — Assessment & Plan Note (Signed)
[facility]                             PULMONARY OFFICE NOTE   NAME:[name] [name], [name]                    MRN:          [id number]  DATE:03/27/2007                            DOB:          Feb 16, 1925    [name] [name] is an 77 year old white female not seen actually since  February 2007. She has a previous history of left lobe lung resection  for non-small cell carcinoma of the lung. She has end-stage chronic  renal failure on hemodialysis for 2 years now. History of chronic  obstructive lung disease with asthmatic bronchitis. She is being seen  now for increased cough with productive clear phlegm and increased  dyspnea. She is at her dry weight at 55 kg dialyzing 3 times weekly,  maintains oxygen, 2 liters continuous.   CURRENT MEDICATIONS:  1. Toprol XL 50 mg daily.  2. PhosLo 3 times daily.  3. Levoxyl 125 mcg daily.  4. Aspirin 81 mg daily.  5. Rena-Vite daily.  6. Cartia XT 240 mg daily.   She is not on inhaled medications at this time. She did have preexisting  chronic obstructive lung disease in the past.   PHYSICAL EXAMINATION:  VITAL SIGNS:  Temperature 98, blood pressure  154/62, pulse 69, saturation 85% on room air.  CHEST:  Showed distant breath sounds with prolonged respiratory phase, a  few expired wheezes.  CARDIAC:  Showed a regular rate and rhythm without S3, normal S1, S2.  ABDOMEN:  Soft, nontender.  EXTREMITIES:  Showed no edema or clubbing or venous disease.  SKIN:  Clear.  NEUROLOGIC:  Intact.  HEENT:  Showed jugular venous distention noted, no lymphadenopathy.   Spirometry was obtained and showed an FEV1 of 0.86, FEC of 1.52,  FEV1/FEC ratio of 57% or predicted compatible with severe obstructive  defect.   IMPRESSION:  Severe chronic obstructive lung disease, recent chest x-ray  showing cardiomegaly and interstitial edema, left effusion, left lower  lobe resection. No malignancy recurrence.   PLAN:  Begin Spiriva 1  capsule daily. She was instructed as to its  proper use. We obtained a BNP and an echocardiogram per Dr. Elease Hashimoto to  reassess LV function given the recent findings  of congestive failure on chest x-ray. She will maintain other  medications as is and will see the patient back in followup in 2 weeks.     [name] Cradle Delford Field, MD, [facility]  Electronically Signed    PEW/MedQ  DD: 03/27/2007  DT: 03/28/2007  Job #: [id number]   cc:   Gregary Signs A. Everardo All, MD  Vesta Mixer, M.D.  Terrial Rhodes, M.D.

## 2011-02-20 NOTE — Op Note (Signed)
[name] [name], [name] [name]             ACCOUNT NO.:  [id number]   MEDICAL RECORD NO.:  [id number]          PATIENT TYPE:  INP   LOCATION:  5529                         FACILITY:  [facility]   PHYSICIAN:  Iva Boop, MD,FACGDATE OF BIRTH:  04/26/1925   DATE OF PROCEDURE:  05/22/2007  DATE OF DISCHARGE:  05/22/2007                               OPERATIVE REPORT   PROCEDURE:  Small bowel capsule endoscopy.   Please see the printed report with photos for full details.   INDICATIONS:  Recurrent melena and chronic anemia.   FINDINGS:  Completed study with a good prep.  No findings to explain  melena, only mucosal erythema in the duodenum.   PLAN:  Support for now. She has had EGDs, colonoscopies.  I do not think  any other workup would make sense at this time.      Iva Boop, MD,FACG  Electronically Signed     CEG/MEDQ  D:  06/05/2007  T:  06/05/2007  Job:  [id number]

## 2011-02-20 NOTE — Assessment & Plan Note (Signed)
[facility]                         GASTROENTEROLOGY OFFICE NOTE   NAME:ROBINSONJalasia, Eskridge                    MRN:          [id number]  DATE:07/22/2007                            DOB:          Apr 07, 1925    CHIEF COMPLAINT:  Followup after hospitalization and small bowel  capsular endoscopy.   HISTORY:  Ms. Hodder was hospitalized with a GI bleed in August.  An  EGD was unrevealing.  A capsule endoscopy was performed, and this  demonstrated no significant abnormalities.  It was a good study.  Since  that time she has not had melena.  She did receive 2 units of packed red  cells at that time.  In the interim, she has had increasing ascites, a  worsening right pleural effusion and anasarca.  She has terrible heart  failure.  She is having increasing hepatomegaly as well.  She remains on  generic Protonix twice daily.  She notes that after she eats she has  quite a bit of epigastric discomfort and she will get cramping in her  abdomen when she is on dialysis.   PAST MEDICAL HISTORY:  Reviewed and unchanged from hospital discharge  summary, as noted on May 22, 2007.   MEDICATIONS:  Again listed and reviewed.   ALLERGIES:  Listed and reviewed.   PHYSICAL EXAMINATION:  GENERAL:  A chronically ill, very frail 77-year-  old white woman.  VITAL SIGNS:  Weight 134 pounds, pulse 60 and irregular, blood pressure  106/40.  Her height is 5 feet 1 inch.  ABDOMEN:  Distended with a moderately large amount of ascites.  She does  have a diastasis recti.   ASSESSMENT/PLAN:  Previous melena and upper gastrointestinal bleed  (presumably).  This has resolved.  She has numerous comorbidities that  include end-stage renal disease, terrible congestive heart failure which  must include right heart failure, ascites, chronic obstructive pulmonary  disease, atrial fibrillation and a history of lung cancer and non-  Hodgkin's lymphoma.  She is not really a candidate  for any type of  further intervention at this time.  Certainly if she were hospitalized  we could reconsider, but at this point we will plan to follow her up as  needed.  She will continue supportive care through her nephrologist.  If  the ascites becomes more of a problem, (I have told her to eat smaller  meals), she may need a paracentesis, which could be ordered through  radiology.   Fifteen minutes time was spent with the patient on behalf of counseling  and coordination of care.     Iva Boop, MD,FACG  Electronically Signed    CEG/MedQ  DD: 07/22/2007  DT: 07/23/2007  Job #: [id number]   cc:   Dyke Maes, M.D.  Lajuana Matte, MD

## 2011-02-20 NOTE — Assessment & Plan Note (Signed)
[facility]                             PULMONARY OFFICE NOTE   NAME:[name], [name] [name]                    MRN:          [id number]  DATE:04/29/2007                            DOB:          1925-08-13    [name] [name] returns in followup.  She is an 77 year old white female  with end-stage renal disease, hypertensive diastolic dysfunction with  congestive failure, COPD, peripheral vascular disease.  She is on  dialysis three times week.  Her last visit would determine she had a BNP  of 3400, and she is referred to Dr. Elease Hashimoto for cardiac evaluation.  Echocardiogram had been obtained and showed a diastolic dysfunction with  congestive failure features.  The patient is now seen by Dr. Elease Hashimoto in  followup, and he has prescribed Avapro 75 mg daily, which she is just  initiating.  She is still coughing with clear mucus.  She is back on the  Spiriva 1 capsule daily.  Maintains Cartia 240 mg daily.  Renavite  daily.  Oxygen 2 liters at rest, 3 liters at exertion.  Aspirin 81 mg  daily.  Levoxyl 125 mcg daily.  PhosLo t.i.d.  Toprol XL 50 mg nightly.   PHYSICAL EXAMINATION:  Temp was 97.  Blood pressure 138/60, pulse 73,  saturation was 81% on 2 liters, increased to 86% on 2 liters continuous  on the 2 liter pulse.  CHEST:  Distant breath sounds with prolonged expiratory phase.  No  wheeze or rhonchi noted.  CARDIAC:  Regular rate and rhythm without S3.  Normal S1 and S2.  ABDOMEN:  Soft and nontender.  EXTREMITIES:  No clubbing  edema, or obesity.  SKIN:  Clear.  NEUROLOGIC:  Intact.  HEENT/NECK:  No jugular venous distention, no lymphadenopathy.  Oropharynx clear.  Neck supple.   IMPRESSION:  Chronic obstructive lung disease with associated diastolic  congestive failure.   PLAN:  Continue Avapro as prescribed, continue Spiriva as is, increase  oxygen to 2 liters rest, 5 liters pulse, or 2-3 liters continuous.  We  will see the patient back in return  followup.     [name] Cradle Delford Field, MD, [facility]  Electronically Signed    PEW/MedQ  DD: 04/30/2007  DT: 05/01/2007  Job #: [id number]   cc:   Gregary Signs A. Everardo All, MD  Vesta Mixer, M.D.  Terrial Rhodes, M.D.

## 2011-02-20 NOTE — Assessment & Plan Note (Signed)
[facility]                         GASTROENTEROLOGY OFFICE NOTE   [name], [name]                    MRN:          [id number]  DATE:02/12/2007                            DOB:          September 18, 1925    REFERRING PHYSICIAN:  Dyke Maes, M.D.   REASON FOR CONSULTATION:  Heme positive stools.   ASSESSMENT:  A 77 year old white woman with a complicated medical  history who has had a recent history of self-limited melena x3 episodes  in 1 day.  Subsequently, she had heme positive stool.  She tells me her  hemoglobin is 11.  The etiology of this is not clear, but ulcer disease,  AVMs, other bleeding lesions in the gastrointestinal tract such as  tumors or cancer are possible.  She does have a history of problems with  epistaxis, severe last year, but denies any problems with that now.  Her  aspirin has been held and she is not using heparin at dialysis.   RECOMMENDATIONS AND PLAN:  1. Proceed with an upper GI endoscopy to investigate. This will be      done at the hospital on Tuesday, May 13.  Risks, benefits and      indications are explained.  She understands and agrees to proceed.  2. Pending that, she could need a repeat colonoscopy, versus a capsule      endoscopy.  She had an asymptomatic esophageal stricture on EGD      April 12, 2005 by Dr. Marina Goodell, and also had a colonoscopy for workup of      anemia when she was hospitalized then, which was notable for a      lipoma and a diminutive colon polyp that was hyperplastic.   See my medical history, physical form for full details.   PAST MEDICAL HISTORY/PROBLEMS:  1. Anemia with negative gastrointestinal workup 2006 as described      above.  2. Nasal hemorrhage right-sided, treated by Dr. Christella Hartigan September 17, 2006.  3. End-stage renal disease on dialysis Monday, Wednesday, Friday.  4. Congestive heart failure with diastolic dysfunction.  5. Pulmonary hypertension.  6. Splenectomy,  secondary to splenic biopsy showing non-Hodgkin's      lymphoma, with splenic rupture.  7. Atrial fibrillation.  8. Non-small cell carcinoma of the left lower lobe, treated with left      lower wedge resection with radioactive seed implantation December 27, 2005 (Drs. Laneta Simmers and Dr. Kathrynn Running).  9. Hypertension.  10.Dyslipidemia.  11.Status post left carotid endarterectomy.  12.History of smoking.  13.An adrenal mass, presumed to be an adenoma.  14.Osteoarthritis/degenerative joint disease.  15.Secondary hyperparathyroidism.  16.Hypothyroidism.  17.Left hip replacement, 2000.  18.Prior cholecystectomy.  19.Pacemaker.  20.Left forearm arteriovenous graft.  21.Her lung cancer has metastasized to further in the left lung,      followed by Dr. Arbutus Ped.   MEDICATIONS:  1. Toprol XL 50 mg at bedtime.  2. NephroVite daily.  3. PhosLo 667 mg three times daily.  4. Levoxyl 125 mcg every morning.  5. Epogen with dialysis iron IV has been given.  6. Norvasc 10 mg daily.  7. Temazepam nightly.   DRUG ALLERGIES:  ALLOPURINOL, COLCHICINE, CEPHALOSPORINS, ERYTHROMYCIN.   ADDITIONAL MEDICAL PROBLEMS/SURGERY:  Included oophorectomy and  hysterectomy.  Gout, peripheral neuropathy, monoclonal gammopathy in the  past.   PHYSICAL EXAMINATION:  Reveals a chronically ill elderly white woman.  Weight 128 pounds.  Blood pressure 158/80, pulse 64.  Eyes anicteric.  LUNGS:  Clear.  HEART:  Resonant heart, S1, S2. I can hear no rubs, murmurs or gallops.  ABDOMEN:  Is soft.  She has a midline surgical scar.  She has a small  bulge versus hernia in the epigastric area, soft, nontender.  SKIN:  Multiple ecchymoses.  PSYCH:  She is alert and oriented x3.   LAB TESTS:  We have requested these.  She was an excellent historian.   ADDITIONAL CONSIDERATIONS:  If she has a capsule endoscopy, pacemaker  could be an issue.  She has been losing some weight, but thinks it is  related to dialysis and pulling  off of fluid.  Her left upper extremity  shows her AV fistula.     Iva Boop, MD,FACG  Electronically Signed    CEG/MedQ  DD: 02/12/2007  DT: 02/12/2007  Job #: [id number]   cc:   Dyke Maes, M.D.  Lajuana Matte, MD

## 2011-02-20 NOTE — Consult Note (Signed)
[name] [name], [name] [name]             ACCOUNT NO.:  [id number]   MEDICAL RECORD NO.:  [id number]          PATIENT TYPE:  INP   LOCATION:  3712                         FACILITY:  [facility]   PHYSICIAN:  James L. Deterding, M.D.DATE OF BIRTH:  [birth year]   DATE OF CONSULTATION:  07/29/2007  DATE OF DISCHARGE:                                 CONSULTATION   NEPHROLOGY CONSULTATION:   REASON FOR CONSULTATION:  1. Congestive heart failure.  2. Fluid overload.  3. Pleural effusions.  4. Ascites.  5. End-stage renal disease.  6. Anemia.   HISTORY OF PRESENT ILLNESS:  This is an 77 year old female with end-  stage renal disease secondary to FSG, past history of hypertension,  congestive heart failure, paroxysmal atrial fibrillation,  hypothyroidism, DJD, COPD, who is status post left lower lobe wedge  resection and also radium implants for non-small cell carcinoma.  She  also has a non-Hodgkin's lymphoma followed by Dr. Si Gaul.  She  was at Dr. Harvie Bridge office for her usual appointment and was very short  of breath.  Came in for thoracentesis.  This has been progressing over  several weeks period of time.  She notes some ankle edema.  She notes  she sleeps on two to three pillows, has some PND and shortness of breath  with minimal exertion.  She has had no cough, fevers or chills.  She  dialyzes Monday, Wednesday and Friday for 4 1/4 hours with IV access in  her left upper arm graft.  She is admitted for a thoracentesis and  management of heart failure.  She also has a history of gout, status  post cholecystectomy on February of 2005, history of  hyperparathyroidism, and left carotid endarterectomy and a right  internal carotid artery stent, a graft infection in October 2007,  history of GI bleed in August 2008, and a capsule endoscopy, history of  paroxysmal atrial fibrillation, hypothyroidism, CHF with diastolic  dysfunction, left hip replacement in 2000.  She also has a history of  an  upper arm graft placement.   MEDICATIONS:  Medications at home include:  1. Atarax p.r.n.  2. Avapro 150 mg.  3. Cardizem CD 240 mg a day.  4. Proair inhaler.  5. Levothyroxine 0.125 mg a day.  6. Nephrovites once a day.  7. Spiriva 2 puffs daily.  8. PhosLo 2 at meals.  9. Protonix 40 mg a day.  10.Toprol XL 50 mg a day.  11.Ambien.   SOCIAL HISTORY:  She lives in a condo by herself.  She has quit smoking  in 2000, about a 40-pack-year history of smoking.  No alcohol.  She is  widowed.   FAMILY HISTORY:  Negative for renal disease, positive for hypertension.   REVIEW OF SYSTEMS:  In general, she has had no fevers or chills, cough,  nausea or vomiting.  Appetite has been fair.  HEENT:  No visual  difficulties or hearing difficulties or headache.  SKIN:  Other than  bruisability, nothing specific.  CARDIOPULMONARY:  As listed above.  GU:  Makes a very small amount of urine.  NEUROPSYCHIATRIC:  Benign.  Musculoskeletal:  Significant for great toe pain.  GI:  No nausea,  vomiting or diarrhea, indigestion or heartburn.  No constipation or  diarrhea.   PHYSICAL EXAMINATION:  VITAL SIGNS:  Temperature of 98.7.  Pulse of 75.  Blood pressure 133/60.  Has 100% saturation on five liters.  HEENT:  Fundi show no disk or vascular abnormalities.  Sclerae  unremarkable.  Pharynx unremarkable.  NECK:  Without masses or thyromegaly.  There is no significant  lymphadenopathy or posterior cervical adenopathy.  CARDIOVASCULAR:  Regular rate, there is an S4 and a grade 1-2/6 systolic  ejection murmur best heard at the left sternal border.  PMI is in mid  systolic space.  She has 2 to 3+ peripheral edema.  She has abdominal  wall edema.  LUNGS:  Revealed dullness to percussion in the right base, crackles  above that.  No crackles in the left base.  SKIN:  Shows a lot of bruising.  Thin skin, a number of bruises.  ABDOMINAL EXAMINATION:  Liver sounds 3 to 4 cm, positive fluid wave,   abdominal wall edema.  GU AND RECTAL:  Not done.  EXTREMITIES:  Showed the 2+ edema, cool feet.  Access left upper arm.  MUSCULOSKELETAL:  Left great toe was tender, slightly reddened.  NEUROLOGICAL:  Alert and oriented.  Motor is 4/5 and symmetric.  Reflexes are 1+/4+, toes downgoing.   LABORATORY DATA:  CT on October 7 showed ascites and anasarca, bilateral  effusions.  Hemoglobin 12.9, white count of 6700, platelets 168,000.  Sodium 130, potassium 3.5, chloride 9.3, bicarb 26, creatinine of 4.9,  BUN at 22, glucose 388, albumin 3.2, calcium 9.7.   ASSESSMENT:  1. Volume excess:  At this time, etiology is probably multifactorial.      Has poor diastolic dysfunction, but has primarily admitted she has      been on a number of drugs that affect her blood pressure and has      not been able to get as much fluid off as really needs to be done.      Those medications need to be out of her system in terms of the      Cardizem and also the Avapro.  We need to gradually reduce her      volume and take it down very significantly and with her small body      size, take 5 to 8% of body weight off at a time as well as feasible      and she needs a lot more than that off.  We will need to dialyze      her serially to try to get that off.  She may need a paracentesis      also, but she has a lot of interstitial fluid also.  2. End-stage renal disease:  As above.  Clearances are okay, but the      primary issue is volume removal.  3. Anemia:  We will follow her hemoglobin and see how her iron status      is.  4. Hyperparathyroidism:  We will keep her on vitamin D and control the      phosphorus.  5. Hypertension:  Actually blood pressure is not an issue and we need      to make sure she is not on these medications.  6. History of non-Hodgkin's lymphoma.  7. History of non-small cell carcinoma:  We need to make sure that is      not playing  a role in her effusion.  8. Chronic obstructive  pulmonary disease:  Obviously the fluid      aggravates this.  9. History of paroxysmal atrial fibrillation:  We will continue using      a low-dose beta blocker, maybe amiodarone.  10.Nutrition:  She needs to increase her protein intake.  11.Diastolic dysfunction.  12.Prophylaxis: We will make sure she gets on a proton pump inhibitor.   PLAN:  As above.           ______________________________  Llana Aliment. Deterding, M.D.     JLD/MEDQ  D:  07/29/2007  T:  07/30/2007  Job:  [id number]

## 2011-02-20 NOTE — Op Note (Signed)
[name], [name]             ACCOUNT NO.:  [id number]   MEDICAL RECORD NO.:  [id number]          PATIENT TYPE:  INP   LOCATION:  NA                           FACILITY:  [facility]   PHYSICIAN:  Quita Skye. Hart Rochester, M.D.  DATE OF BIRTH:  05-Feb-1925   DATE OF PROCEDURE:  08/07/2007  DATE OF DISCHARGE:                               OPERATIVE REPORT   PREOPERATIVE DIAGNOSIS:  Ischemic left leg secondary to severe iliac  occlusive disease, femoral popliteal and tibial occlusive disease.   POSTOPERATIVE DIAGNOSIS:  Ischemic left leg secondary to severe iliac  occlusive disease, femoral popliteal and tibial occlusive disease.   OPERATION:  Right-to-left femoral/femoral bypass using an 8 mm  Hemashield Dacron graft.   SURGEON:  Quita Skye. Hart Rochester, M.D.   FIRST ASSISTANT:  Larina Earthly, M.D.   ANESTHESIA:  General endotracheal.   PROCEDURE:  The patient was taken to the operating room and placed in a  supine position at which time satisfactory general endotracheal  anesthesia was administered.  The lower abdomen and both groins were  prepped with Betadine scrub solution and draped in a routine sterile  manner.  The patient was chronically ill and had very poor skin turgor  with a lot of bruising.  Oblique incisions were made in both inguinal  areas just above the inguinal ligament and carried down through  subcutaneous tissue and the common superficial and profunda femoris  arteries were dissected free.  Both common femorals were diffusely  calcified with a few soft areas anteriorly.  The right side had a 3+  pulse, the left side was pulseless.  A suprapubic subcutaneous tunnel  was then created and an 8 mm Hemashield Dacron graft delivered through  the tunnel. 5000 units of heparin were given intravenously.  The  arteries were occluded proximally and distally with vascular clamps, a  longitudinal opening made in an identical fashion in both common femoral  arteries on the anterolateral  aspect where there was a soft area.  This  was done with a 15 blade and extended with Potts scissors.  The 8 mm  graft was spatulated and anastomosed end-to-side to both common femoral  arteries with 5-0 Prolene.  The clamps were then released and there was  an excellent pulse in the graft and good pulse in both femoral arteries.  Protamine was given to reverse the heparin. Following adequate  hemostasis, the wound was irrigated with saline and closed in layers  with Vicryl in subcuticular fashion.  A sterile dressing was applied.  The patient was taken to the recovery room in satisfactory condition.      Quita Skye Hart Rochester, M.D.  Electronically Signed     JDL/MEDQ  D:  08/07/2007  T:  08/07/2007  Job:  [id number]

## 2011-02-20 NOTE — Discharge Summary (Signed)
NAMEJEREE, [name] [name]             ACCOUNT NO.:  [id number]   MEDICAL RECORD NO.:  [id number]          PATIENT TYPE:  INP   LOCATION:  2116                         FACILITY:  [facility]   PHYSICIAN:  Felipa Evener, MD  DATE OF BIRTH:  August 23, 1925   DATE OF ADMISSION:  07/29/2007  DATE OF DISCHARGE:  08/14/2007                               DISCHARGE SUMMARY   DEATH SUMMARY   PRIMARY DIAGNOSIS:  Septic shock.   SECONDARY DIAGNOSES:  1. Ventilator-dependent respiratory failure.  2. Cardiogenic shock.  3. Heart failure.  4. Septic shock.  5. End-stage renal disease.  6. Ileus.   ID:  The patient was an 77 year old female who was admitted to Dr.  Jacqlyn Krauss service, underwent surgical procedure on August 07, 2007, for an ischemic leg, on postop day 1 developed an ileus, septic  shock, metabolic acidosis, was intubated and started on pressor on  August 08, 2007.  She also has history of end-stage renal disease, lung  cancer, and pulmonary hypertension.   The patient was transferred to the Intensive Care Unit.  Critical Care  Service was asked to assist with the management of the critical  component of her illness as well as the ventilator.  After caring for  the patient approximately 5 days, I met with the family of the patient  as she was showing no signs of progression.  I spoke with them at length  regarding plans of care.  The family was related that the patient had  not been really that interested in maintaining that level of  resuscitative measures, the patient was pressors dependent at that point  on a fairly high dose and acidosis was only worsening, and the patient  had not been a candidate for dialysis at that point.  I spoke with the  family on August 12, 2007, although they decided to direct here on  August 13, 2007.  The patient was extubated and was maintained  comfortable at the Fentanyl drip.  She was to be transferred on August 14, 2007 to the hospice  floor, however, expired later on that day  comfortably with the family at bedside.      Felipa Evener, MD  Electronically Signed     WJY/MEDQ  D:  01/20/2008  T:  01/21/2008  Job:  [id number]

## 2011-02-20 NOTE — Consult Note (Signed)
[name] [name], [name] [name]             ACCOUNT NO.:  [id number]   MEDICAL RECORD NO.:  [id number]          PATIENT TYPE:  INP   LOCATION:  3309                         FACILITY:  [facility]   PHYSICIAN:  Oretha Milch, MD      DATE OF BIRTH:  Mar 18, 1925   DATE OF CONSULTATION:  05/15/2007  DATE OF DISCHARGE:                                 CONSULTATION   REFERRING PHYSICIAN:  Nephrology [name].   REASON FOR CONSULTATION:  Shortness of breath and wheezing.   HISTORY OF PRESENT ILLNESS:  [name] [name] is an 77 year old Caucasian  woman with severe COPD who has been admitted for dark stools and acute  blood loss anemia.  Spirometry in the past has shown an FEV1 of 0.86,  FEV1 or FVC ratio 57% suggesting severe airway obstruction.  She has a  history of non-small-cell lung cancer and diagnosed by wedge resection  in February 2007.  She has had radioactive seed implantation and follows  up with Dr. Arbutus Ped.  I presume that because of her poor lung function  she was not considered as a candidate for resection.   She was now admitted with dark stools, a hemoglobin of 7.3 (baseline of  11.3 on May 07, 2007), shortness of breath and chest tightness which is  substernal, worse with breathing and nonradiating.  She also describes  epigastric constant aching pain.   Some wheezing was noted on admission.  Her home regimen includes Spiriva  HandiHaler.  She has also been maintained on beta blocker for a long  duration.  She is being transfused now and states that her breathing is  much improved.   PAST MEDICAL HISTORY:  1. End stage renal disease secondary to FSG on hemodialysis for 2      years.  2. Nonsmall cell lung cancer status post radioactive seed      implantation.  An adrenal mass has been noted and on her last CT      scan on April 15, 2007, this mass measures at least 4.7 cm x 2.7 cm.      Mediastinal lymphadenopathy has also been noted.  3. History of non-Hodgkin's lymphoma in the remote  past, recent CT      abdomen has shown enlarged retroperitoneal lymphadenopathy and      hepatomegaly without focal lower lesions.  4. Paroxysmal atrial fibrillation.  5. CHF on an echo from September 2007 estimating normal LV function,      diastolic dysfunction and a dilated right ventricle with a peak      pulmonary artery pressure of 55 mm and dilated right atrium.   PAST SURGICAL HISTORY:  Cholecystectomy, splenectomy for bleeding.   ALLERGIES:  ALLOPURINOL, COLCHICINE, CEPHALOSPORINS, ERYTHROMYCIN,  ANCEF.   FAMILY HISTORY:  Noncontributory.   SOCIAL HISTORY:  She quit smoking in 2000.  Smoked 2 to 3 packs per day  for about 50 years prior to that.  She is widowed.  Lives by herself in  [place].   HOME MEDICATIONS:  1. Avapro.  2. Spiriva.  3. Cartia XT 240 mg daily.  4. Restoril 30 mg by mouth  every night as needed.  5. Phos-Lo 3 times a day.  6. Aspirin 81 mg by mouth daily.  7. Toprol XL 50 mg by mouth.  8. Nephro-Vite 1 tablet by mouth daily.  9. Levothyroxine 125 mcg by mouth daily.   REVIEW OF SYSTEMS:  Currently denies chest pain.  Her breathing is much  improved.   PHYSICAL EXAMINATION:  An elderly woman who appears her stated age, in  no apparent respiratory distress.  Heart rate 84 per minute, irregular,  blood pressure 136/45, oxygen saturation 99% on 3 liters nasal cannula,  respirations 18 per minute.  HEENT:  No postnasal drip.  No thrush.  NECK:  Supple.  No JVD.  No lymphadenopathy.  CARDIOVASCULAR:  S1, S2 irregular.  No S3.  No murmur.  No rub.  CHEST:  Fine crackles both bases which did not disappear on coughing.  ABDOMEN:  Soft, nontender.  NEUROLOGIC:  Nonfocal.  EXTREMITIES:  No edema.   LABORATORY DATA:  Peak troponin is 0.06, INR 1.1, hemoglobin 7.3 on  admission, platelets 197, WBC count 8.7, glucose 91, BUN 20, potassium  3.4.  Stool guaiac positive.  BTNP level on March 27, 2007, was 3360.   IMPRESSION:  1. Severe chronic  obstructive pulmonary disease.  The bronchospasms      seem to have resolved at present.  2. Acute blood loss anemia likely upper gastrointestinal bleed.  3. Nonsmall cell lung cancer.  Based on last imaging study, this is at      least T2 N2 and perhaps M1.  It is unclear to me at present whether      the abdominal lymphadenopathy could represent recurrence of      lymphoma.  4. Remote history of non-Hodgkin's lymphoma.  5. Atrial fibrillation, not on Coumadin due to prior history of      gastrointestinal bleeding.   RECOMMENDATIONS:  1. Her bronchospasms to have resolved at present and I do not see any      need for steroids.  We can use Atrovent nebs 0.5 mg every 6 hours      during her hospital stay and on discharge put her back on the      outpatient regimen of tiotropium.  Albuterol can be used only if      needed.  2. I do note that she seems to have tolerated beta blocker well in the      past.  3. We will stand by.  She will likely need conscious sedation for      endoscopy.  4. We will be available as needed.      Oretha Milch, MD  Electronically Signed     RVA/MEDQ  D:  05/15/2007  T:  05/15/2007  Job:  [id number]

## 2011-02-20 NOTE — Consult Note (Signed)
NAMEKRISSA, UTKE NO.:  [id number]   MEDICAL RECORD NO.:  [id number]          PATIENT TYPE:  INP   LOCATION:  5529                         FACILITY:  [facility]   PHYSICIAN:  Vesta Mixer, M.D. DATE OF BIRTH:  August 04, 1925   DATE OF CONSULTATION:  05/16/2007  DATE OF DISCHARGE:  05/22/2007                                 CONSULTATION   CARDIOLOGY CONSULTATION    [name] [name] is an 77 year old female with a history of known  congestive heart failure, end-stage renal disease, and pacemaker.  She  has had only minor coronary artery irregularities by cath in 2006.   She was admitted with a GI bleed and severe dyspnea.  She had been  having problems over the past several days.  She had severe fatigue and  shortness of breath.  She was recently found to have a GI bleed.  Her  left ventricular systolic function has been fairly normal recently.  She  does have known diastolic dysfunction.   She also has a history of congestive heart failure induced by right  ventricular pacing.  Following that we decreased the pacing rate so that  she had more of an intrinsic rate led to the resolution of a lot of her  congestive heart failure symptoms.   She has had lots of problems with her dialysis graft and infections.   She has a history of lung cancer, atrial fibrillation, and non-Hodgkins  lymphoma.  She has done fairly well from a cardiac standpoint.  She has  had some rapid atrial fibrillation associated with dialysis graft  infection.  She still apparently is getting vancomycin with dialysis  treatments for her presumed MRSA infection.   The patient was recently found to have significant anemia.  She now  feels quite a bit better after getting some transfusions.  We are asked  to see her for follow-up.  She does not have any specific cardiac  complaints.   PAST MEDICAL HISTORY:  1. History of lung cancer.  2. Atrial fibrillation.  3. Congestive heart failure -  mostly diastolic dysfunction.  4. History of non-Hodgkins lymphoma.  5. Mild coronary artery irregularities.  6. COPD.  7. End-stage renal disease - on dialysis.   SOCIAL HISTORY:  The patient is a nonsmoker.   FAMILY HISTORY:  Unremarkable.   REVIEW OF SYSTEMS:  Unremarkable.   PHYSICAL EXAMINATION:  GENERAL:  She is an elderly female in no acute  distress.  She is alert and oriented x3.  Mood and affect are normal.  VITAL SIGNS:  Temperature is 99, blood pressure 137/47, heart rate 70.  HEENT:  Reveals 2+ carotids.  She has no bruits, no JVD, no thyromegaly.  LUNGS:  Reveals a few basilar rales.  HEART:  Irregularly irregular.  ABDOMEN:  Nontender.  EXTREMITIES:  She has no edema.  NEUROLOGICAL:  Nonfocal.   LABORATORY DATA:  Hemoglobin is 9.5, hematocrit 28.3, white blood cell  count 8.7.  Sodium 137, potassium 3.9, chloride 97, CO2 30, BUN 42,  creatinine 3.6, glucose 82.   IMPRESSION/PLAN:  Dyspnea.  The patient seems to be at  baseline from a  cardiac standpoint.  I suspect a lot of her dyspnea was due to her  profound anemia which has now resolved following transfusions.  Continue  with her same therapy.  She had an echocardiogram in October 2007 at  [facility].  I think that she has had an echocardiogram more recently in our  office.  We will review our records for further evaluation.  We do not  have to make any medication adjustments from a cardiac standpoint at  this time.           ______________________________  Vesta Mixer, M.D.     PJN/MEDQ  D:  05/27/2007  T:  05/27/2007  Job:  [id number]   cc:   Terrial Rhodes, M.D.  Charlcie Cradle Delford Field, MD, FCCP

## 2011-02-23 NOTE — Cardiovascular Report (Signed)
[name] [name], [name] [name]             ACCOUNT NO.:  [id number]   MEDICAL RECORD NO.:  [id number]          PATIENT TYPE:  OIB   LOCATION:  2852                         FACILITY:  [facility]   PHYSICIAN:  Vesta Mixer, M.D. DATE OF BIRTH:  November 22, 1924   DATE OF PROCEDURE:  07/31/2005  DATE OF DISCHARGE:                              CARDIAC CATHETERIZATION   [name] [name] is an elderly female with a history of congestive heart care.  She also has a history of renal insufficiency and is on dialysis. She has a  history of syncope and status post pacemaker.  She was recently found by  echocardiogram to have a significantly reduced left ventricular systolic  function. She was also found to have moderate to severe pulmonary  hypertension. She is referred for heart catheterization for further  evaluation.   The procedure was left and right heart catheterization. The right femoral  artery and right femoral vein were easily cannulated using a modified  Seldinger technique.   HEMODYNAMIC RESULTS:  RA pressure was 11. RV pressure was 53/8. The PA  pressure was 55/23 with a mean of 36. Pulmonary capillary wedge pressure was  a mean of 21. The left ventricular pressure was 140/13 with an aortic  pressure of 141/49. Pulmonary artery saturation was 63%. Aortic saturation  was 90%. Cardiac output by thermodilution was 4.2 with a cardiac index of  2.6. With Fick, the cardiac output was 3.2 with Fick cardiac index of 2.   There was no significant mitral valve gradient measured on the simultaneous  wedge/LV EDP measurements.   ANGIOGRAPHY:  Left ventriculogram:  The left ventriculogram generally  reveals a reduced left ventricular systolic function. The ejection fraction  is approximately 30% on the post PVC beats.   The initial beats, which were ectopic beats, demonstrated a better than  expected left ventricular systolic function with an injection fraction of  around 40-45%. Once the heart had settled  out and the pacer started pacing,  the apex became fairly akinetic, and the inferior apical and intra-apical  walls became severely hypokinetic. The ejection fraction of these beats was  closer to 25%.   CORONARY ANGIOGRAPHY:  Left Main: The left main was fairly normal.   Left anterior descending artery is fairly smooth and normal. There were  several small to moderate size diagonal branches.   The left circumflex artery is a large branch. There is a large obtuse  marginal artery which is also fairly normal.   The posterolateral segment artery is normal.   The right coronary artery has minor irregularities in the proximal and mid  segments between 20 and 40%.   COMPLICATIONS:  None.   CONCLUSION:  1.  Minor coronary artery irregularities. She has some diffuse      irregularities in the proximal and mid right coronary artery, but these      are not obstructive lesions.  2.  Her left ventricular systolic function is at least moderately reduced.      The contractility of the immediate post PVC beats indicates only mild      depression while the subsequent paced  beats show a more significantly      reduced left ventricular systolic function with an ejection fraction of      around 25%. This may indicate that she may benefit from biventricular      AICD.  3.  Moderate pulmonary hypertension with pulmonary pressures in the mid 50      range.           ______________________________  Vesta Mixer, M.D.     PJN/MEDQ  D:  07/31/2005  T:  07/31/2005  Job:  [id number]   cc:   Terrial Rhodes, M.D.  Fax: [phone]   Sean A. Everardo All, M.D. [facility]  [address]  [place]  [place] [address]   Shan Levans, M.D. [facility]  [address]  [place]  [place] [address]

## 2011-07-17 LAB — POCT I-STAT 3, ART BLOOD GAS (G3+)
Acid-base deficit: 12 — ABNORMAL HIGH
Acid-base deficit: 9 — ABNORMAL HIGH
Bicarbonate: 16.5 — ABNORMAL LOW
O2 Saturation: 90
O2 Saturation: 94
O2 Saturation: 95
Operator id: 222511
Operator id: 245492
Patient temperature: 98
Patient temperature: 98.2
Patient temperature: 99.9
TCO2: 15
TCO2: 17
pCO2 arterial: 33.8 — ABNORMAL LOW
pCO2 arterial: 57.5
pH, Arterial: 7.056 — CL
pH, Arterial: 7.28 — ABNORMAL LOW
pH, Arterial: 7.312 — ABNORMAL LOW
pO2, Arterial: 76 — ABNORMAL LOW
pO2, Arterial: 83

## 2011-07-17 LAB — CBC
HCT: 34.2 — ABNORMAL LOW
HCT: 34.9 — ABNORMAL LOW
HCT: 35.8 — ABNORMAL LOW
Hemoglobin: 11.1 — ABNORMAL LOW
Hemoglobin: 11.4 — ABNORMAL LOW
MCHC: 31.6
MCHC: 31.8
MCHC: 31.9
MCHC: 32.3
MCHC: 32.7
MCV: 87.7
MCV: 87.9
MCV: 89.2
MCV: 91
Platelets: 147 — ABNORMAL LOW
Platelets: 202
Platelets: ADEQUATE
RBC: 4.02
RDW: 23.5 — ABNORMAL HIGH
RDW: 23.6 — ABNORMAL HIGH
RDW: 24.1 — ABNORMAL HIGH
WBC: 16.3 — ABNORMAL HIGH

## 2011-07-17 LAB — COMPREHENSIVE METABOLIC PANEL
AST: 45 — ABNORMAL HIGH
Albumin: 1.9 — ABNORMAL LOW
Alkaline Phosphatase: 68
BUN: 26 — ABNORMAL HIGH
BUN: 47 — ABNORMAL HIGH
CO2: 16 — ABNORMAL LOW
Calcium: 8.5
Creatinine, Ser: 3.49 — ABNORMAL HIGH
Creatinine, Ser: 6.01 — ABNORMAL HIGH
GFR calc Af Amer: 8 — ABNORMAL LOW
GFR calc non Af Amer: 7 — ABNORMAL LOW
Glucose, Bld: 105 — ABNORMAL HIGH
Potassium: 3.3 — ABNORMAL LOW
Total Protein: 4.7 — ABNORMAL LOW

## 2011-07-17 LAB — CK TOTAL AND CKMB (NOT AT ARMC)
CK, MB: 4.1 — ABNORMAL HIGH
CK, MB: 4.5 — ABNORMAL HIGH
CK, MB: 6.3 — ABNORMAL HIGH
CK, MB: 6.5 — ABNORMAL HIGH
Relative Index: 4.1 — ABNORMAL HIGH
Relative Index: 5.1 — ABNORMAL HIGH
Relative Index: INVALID
Relative Index: INVALID
Total CK: 10316 — ABNORMAL HIGH
Total CK: 111
Total CK: 124
Total CK: 65

## 2011-07-17 LAB — LACTATE DEHYDROGENASE, ISOENZYMES
LDH 3: 10 %Total — ABNORMAL LOW (ref 16–22)
LDH 4: 5 %Total — ABNORMAL LOW (ref 8–15)
LDH 5: 7 %Total (ref 6–23)
LDH Isoenzymes, Total: 235 U/L — ABNORMAL HIGH (ref 105–230)

## 2011-07-17 LAB — RENAL FUNCTION PANEL
Albumin: 1.7 — ABNORMAL LOW
Albumin: 2.2 — ABNORMAL LOW
CO2: 16 — ABNORMAL LOW
CO2: 17 — ABNORMAL LOW
Calcium: 8.5
Calcium: 8.6
Chloride: 105
Chloride: 110
Creatinine, Ser: 5.21 — ABNORMAL HIGH
Creatinine, Ser: 6.38 — ABNORMAL HIGH
Creatinine, Ser: 6.54 — ABNORMAL HIGH
GFR calc Af Amer: 10 — ABNORMAL LOW
GFR calc Af Amer: 8 — ABNORMAL LOW
GFR calc non Af Amer: 6 — ABNORMAL LOW
GFR calc non Af Amer: 8 — ABNORMAL LOW
Glucose, Bld: 121 — ABNORMAL HIGH
Phosphorus: 4
Phosphorus: 6.2 — ABNORMAL HIGH
Sodium: 137

## 2011-07-17 LAB — CARBOXYHEMOGLOBIN
Carboxyhemoglobin: 2.1 — ABNORMAL HIGH
O2 Saturation: 74.9

## 2011-07-17 LAB — LACTIC ACID, PLASMA: Lactic Acid, Venous: 1

## 2011-07-17 LAB — TROPONIN I
Troponin I: 3.84
Troponin I: 6.91
Troponin I: 9.52

## 2011-07-17 LAB — MAGNESIUM: Magnesium: 1.8

## 2011-07-17 LAB — PHOSPHORUS: Phosphorus: 3.4

## 2011-07-18 LAB — BODY FLUID CULTURE: Gram Stain: NONE SEEN

## 2011-07-18 LAB — CBC
HCT: 41.2
HCT: 42.5
HCT: 43.1
HCT: 43.5
Hemoglobin: 11.1 — ABNORMAL LOW
Hemoglobin: 11.2 — ABNORMAL LOW
Hemoglobin: 12.9
Hemoglobin: 13.1
Hemoglobin: 13.1
Hemoglobin: 13.2
Hemoglobin: 13.2
Hemoglobin: 13.2
Hemoglobin: 13.4
MCHC: 30.9
MCHC: 31
MCHC: 31
MCHC: 31.1
MCHC: 31.3
MCHC: 32
MCV: 90.3
MCV: 90.7
MCV: 91.3
MCV: 91.5
MCV: 92.8
Platelets: 143 — ABNORMAL LOW
Platelets: 168
RBC: 4.5
RBC: 4.56
RBC: 4.59
RBC: 4.65
RBC: 4.66
RBC: 4.68
RBC: 4.75
RDW: 23.5 — ABNORMAL HIGH
RDW: 23.9 — ABNORMAL HIGH
RDW: 24.3 — ABNORMAL HIGH
RDW: 25.1 — ABNORMAL HIGH
RDW: 26.1 — ABNORMAL HIGH
WBC: 6.3
WBC: 6.7
WBC: 8.7

## 2011-07-18 LAB — CARBOXYHEMOGLOBIN
Methemoglobin: 0.8
Methemoglobin: 0.8
O2 Saturation: 65.2
Total hemoglobin: 10.9 — ABNORMAL LOW
Total hemoglobin: 11 — ABNORMAL LOW

## 2011-07-18 LAB — DIFFERENTIAL
Basophils Absolute: 0.1
Basophils Relative: 1
Basophils Relative: 1
Eosinophils Absolute: 0
Eosinophils Relative: 4
Lymphocytes Relative: 5 — ABNORMAL LOW
Lymphs Abs: 1.2
Monocytes Absolute: 0.9 — ABNORMAL HIGH
Monocytes Absolute: 1.1 — ABNORMAL HIGH
Neutrophils Relative %: 86 — ABNORMAL HIGH
Smear Review: DECREASED

## 2011-07-18 LAB — BASIC METABOLIC PANEL
CO2: 27
CO2: 29
CO2: 31
Calcium: 9.2
Calcium: 9.3
Chloride: 95 — ABNORMAL LOW
Chloride: 97
Creatinine, Ser: 3.1 — ABNORMAL HIGH
Creatinine, Ser: 4.32 — ABNORMAL HIGH
GFR calc Af Amer: 17 — ABNORMAL LOW
GFR calc Af Amer: 18 — ABNORMAL LOW
GFR calc non Af Amer: 15 — ABNORMAL LOW
Glucose, Bld: 78
Potassium: 3.6
Sodium: 133 — ABNORMAL LOW
Sodium: 136

## 2011-07-18 LAB — POCT I-STAT 7, (LYTES, BLD GAS, ICA,H+H)
Acid-Base Excess: 1
Bicarbonate: 27.5 — ABNORMAL HIGH
O2 Saturation: 100
Sodium: 132 — ABNORMAL LOW
TCO2: 29

## 2011-07-18 LAB — CULTURE, BLOOD (ROUTINE X 2)

## 2011-07-18 LAB — CROSSMATCH
Antibody Screen: POSITIVE
Donor AG Type: NEGATIVE

## 2011-07-18 LAB — BODY FLUID CELL COUNT WITH DIFFERENTIAL
Lymphs, Fluid: 56
Neutrophil Count, Fluid: 3

## 2011-07-18 LAB — RENAL FUNCTION PANEL
Albumin: 2.7 — ABNORMAL LOW
Albumin: 3 — ABNORMAL LOW
BUN: 19
BUN: 23
BUN: 25 — ABNORMAL HIGH
BUN: 31 — ABNORMAL HIGH
CO2: 24
CO2: 26
CO2: 27
CO2: 28
Calcium: 10.5
Calcium: 9.1
Calcium: 9.9
Chloride: 101
Chloride: 91 — ABNORMAL LOW
Chloride: 92 — ABNORMAL LOW
Chloride: 96
Creatinine, Ser: 3.95 — ABNORMAL HIGH
Creatinine, Ser: 4.45 — ABNORMAL HIGH
Creatinine, Ser: 5.06 — ABNORMAL HIGH
GFR calc Af Amer: 10 — ABNORMAL LOW
GFR calc Af Amer: 13 — ABNORMAL LOW
GFR calc Af Amer: 13 — ABNORMAL LOW
GFR calc non Af Amer: 11 — ABNORMAL LOW
GFR calc non Af Amer: 11 — ABNORMAL LOW
GFR calc non Af Amer: 8 — ABNORMAL LOW
Glucose, Bld: 80
Glucose, Bld: 84
Glucose, Bld: 97
Phosphorus: 2.6
Potassium: 4.2
Potassium: 4.3
Sodium: 127 — ABNORMAL LOW
Sodium: 129 — ABNORMAL LOW

## 2011-07-18 LAB — POCT I-STAT 3, ART BLOOD GAS (G3+)
Acid-base deficit: 4 — ABNORMAL HIGH
Acid-base deficit: 6 — ABNORMAL HIGH
Bicarbonate: 19.9 — ABNORMAL LOW
Bicarbonate: 27.7 — ABNORMAL HIGH
O2 Saturation: 91
Operator id: 229971
Patient temperature: 100.9
Patient temperature: 99.6
TCO2: 21
pCO2 arterial: 75.2
pH, Arterial: 7.104 — CL
pO2, Arterial: 73 — ABNORMAL LOW

## 2011-07-18 LAB — CULTURE, BAL-QUANTITATIVE W GRAM STAIN

## 2011-07-18 LAB — CK TOTAL AND CKMB (NOT AT ARMC)
CK, MB: 15.7 — ABNORMAL HIGH
Total CK: 304 — ABNORMAL HIGH

## 2011-07-18 LAB — COMPREHENSIVE METABOLIC PANEL
ALT: 16
Albumin: 3.2 — ABNORMAL LOW
Alkaline Phosphatase: 96
Glucose, Bld: 88
Potassium: 3.5
Sodium: 130 — ABNORMAL LOW
Total Protein: 6.7

## 2011-07-18 LAB — BLOOD GAS, ARTERIAL
Acid-Base Excess: 4.9 — ABNORMAL HIGH
Bicarbonate: 22.6
PEEP: 7
Pressure support: 14
pCO2 arterial: 66.3
pO2, Arterial: 74.6 — ABNORMAL LOW

## 2011-07-18 LAB — URINE CULTURE: Colony Count: >=100000

## 2011-07-18 LAB — ANAEROBIC CULTURE

## 2011-07-18 LAB — CULTURE, RESPIRATORY W GRAM STAIN

## 2011-07-18 LAB — AMYLASE: Amylase: 168 — ABNORMAL HIGH

## 2011-07-18 LAB — PH, BODY FLUID: pH, Fluid: 7

## 2011-07-18 LAB — AFB CULTURE WITH SMEAR (NOT AT ARMC)

## 2011-07-18 LAB — LACTATE DEHYDROGENASE, PLEURAL OR PERITONEAL FLUID: LD, Fluid: 96 — ABNORMAL HIGH

## 2011-07-18 LAB — GLUCOSE, SEROUS FLUID: Glucose, Fluid: 95

## 2011-07-18 LAB — LACTIC ACID, PLASMA: Lactic Acid, Venous: 1.8

## 2011-07-18 LAB — LIPASE, BLOOD: Lipase: 28

## 2011-07-18 LAB — PROTEIN, BODY FLUID

## 2011-07-23 LAB — URINALYSIS, ROUTINE W REFLEX MICROSCOPIC
Bilirubin Urine: NEGATIVE
Glucose, UA: 100 — AB
Ketones, ur: NEGATIVE
Nitrite: NEGATIVE
Protein, ur: >300 — AB
Specific Gravity, Urine: 1.015
Urobilinogen, UA: 0.2
pH: 8

## 2011-07-23 LAB — POCT CARDIAC MARKERS
CKMB, poc: <1 — ABNORMAL LOW
Myoglobin, poc: 157
Operator id: 272551
Troponin i, poc: <0.05

## 2011-07-23 LAB — CBC
HCT: 21.7 — ABNORMAL LOW
Hemoglobin: 7.3 — CL
Hemoglobin: 9.2 — ABNORMAL LOW
Hemoglobin: 9.4 — ABNORMAL LOW
Hemoglobin: 9.5 — ABNORMAL LOW
MCHC: 33
MCHC: 33
MCHC: 33.9
MCV: 83.8
MCV: 90.1
Platelets: 197
Platelets: 206
Platelets: 216
Platelets: 245
RBC: 2.59 — ABNORMAL LOW
RBC: 3.27 — ABNORMAL LOW
RBC: 3.3 — ABNORMAL LOW
RDW: 20.2 — ABNORMAL HIGH
RDW: 20.9 — ABNORMAL HIGH
RDW: 21 — ABNORMAL HIGH
RDW: 22.6 — ABNORMAL HIGH
RDW: 23.4 — ABNORMAL HIGH
WBC: 4.2
WBC: 5.3
WBC: 8.1
WBC: 8.7
WBC: 8.7

## 2011-07-23 LAB — CARDIAC PANEL(CRET KIN+CKTOT+MB+TROPI)
CK, MB: 2.1
CK, MB: 3
Relative Index: INVALID
Total CK: 75
Troponin I: 0.06
Troponin I: 0.06

## 2011-07-23 LAB — RENAL FUNCTION PANEL
Albumin: 3.1 — ABNORMAL LOW
Albumin: 3.2 — ABNORMAL LOW
CO2: 30
Calcium: 9.4
Calcium: 9.7
Chloride: 93 — ABNORMAL LOW
Chloride: 96
Chloride: 97
Creatinine, Ser: 2.68 — ABNORMAL HIGH
Creatinine, Ser: 5 — ABNORMAL HIGH
GFR calc Af Amer: 10 — ABNORMAL LOW
GFR calc Af Amer: 21 — ABNORMAL LOW
GFR calc non Af Amer: 17 — ABNORMAL LOW
GFR calc non Af Amer: 8 — ABNORMAL LOW
Glucose, Bld: 82
Phosphorus: 4.9 — ABNORMAL HIGH
Phosphorus: 4.9 — ABNORMAL HIGH
Potassium: 3.9
Sodium: 135
Sodium: 137

## 2011-07-23 LAB — URINE MICROSCOPIC-ADD ON

## 2011-07-23 LAB — DIFFERENTIAL
Basophils Absolute: 0.2 — ABNORMAL HIGH
Basophils Relative: 2 — ABNORMAL HIGH
Eosinophils Absolute: 0.3
Eosinophils Relative: 3
Lymphocytes Relative: 13
Lymphs Abs: 1.1
Monocytes Absolute: 1.5 — ABNORMAL HIGH
Monocytes Relative: 17 — ABNORMAL HIGH
Neutro Abs: 5.6
Neutrophils Relative %: 65

## 2011-07-23 LAB — I-STAT 8, (EC8 V) (CONVERTED LAB)
Acid-Base Excess: 11 — ABNORMAL HIGH
BUN: 20
Bicarbonate: 36.3 — ABNORMAL HIGH
Chloride: 97
Glucose, Bld: 91
HCT: 23 — ABNORMAL LOW
Hemoglobin: 7.8 — CL
Operator id: 272551
Potassium: 3.4 — ABNORMAL LOW
Sodium: 136
TCO2: 38
pCO2, Ven: 53.7 — ABNORMAL HIGH
pH, Ven: 7.438 — ABNORMAL HIGH

## 2011-07-23 LAB — CROSSMATCH
ABO/RH(D): A NEG
Antibody Screen: POSITIVE
DAT, IgG: NEGATIVE
Donor AG Type: NEGATIVE
Donor AG Type: NEGATIVE
Donor AG Type: NEGATIVE
Donor AG Type: NEGATIVE
PT AG Type: NEGATIVE

## 2011-07-23 LAB — PROTIME-INR
INR: 1.1
Prothrombin Time: 13.9

## 2011-07-23 LAB — IRON AND TIBC
Iron: 36 — ABNORMAL LOW
TIBC: 256

## 2011-07-23 LAB — POTASSIUM: Potassium: 4.1

## 2011-07-23 LAB — POCT I-STAT CREATININE
Creatinine, Ser: 2 — ABNORMAL HIGH
Operator id: 272551

## 2011-07-23 LAB — URINE CULTURE: Colony Count: 65000

## 2011-07-23 LAB — APTT: aPTT: 30

## 2011-07-23 LAB — FERRITIN: Ferritin: 960 — ABNORMAL HIGH (ref 10–291)

## 2011-07-23 LAB — TROPONIN I: Troponin I: 0.06

## 2011-07-23 LAB — CK TOTAL AND CKMB (NOT AT ARMC)
CK, MB: 2
Relative Index: INVALID
Total CK: 40

## 2011-07-23 LAB — OCCULT BLOOD X 1 CARD TO LAB, STOOL: Fecal Occult Bld: POSITIVE

## 2011-07-23 LAB — LIPASE, BLOOD: Lipase: 50

## 2017-10-16 ENCOUNTER — Telehealth: Payer: Self-pay | Admitting: Cardiology

## 2017-10-17 NOTE — Telephone Encounter (Signed)
Note not needed ( Open In Error )
